# Patient Record
Sex: Female | Born: 1950 | Race: White | Hispanic: No | Smoking: Former smoker
Health system: Southern US, Community
[De-identification: ages and names within clinical notes are randomized; demographics above are authoritative.]

## PROBLEM LIST (undated history)

## (undated) DIAGNOSIS — E079 Disorder of thyroid, unspecified: Secondary | ICD-10-CM

## (undated) DIAGNOSIS — M1712 Unilateral primary osteoarthritis, left knee: Secondary | ICD-10-CM

## (undated) DIAGNOSIS — T8484XA Pain due to internal orthopedic prosthetic devices, implants and grafts, initial encounter: Secondary | ICD-10-CM

## (undated) DIAGNOSIS — Z96652 Presence of left artificial knee joint: Secondary | ICD-10-CM

## (undated) DIAGNOSIS — M1990 Unspecified osteoarthritis, unspecified site: Secondary | ICD-10-CM

## (undated) DIAGNOSIS — R112 Nausea with vomiting, unspecified: Secondary | ICD-10-CM

## (undated) DIAGNOSIS — M1711 Unilateral primary osteoarthritis, right knee: Secondary | ICD-10-CM

## (undated) DIAGNOSIS — M7122 Synovial cyst of popliteal space [Baker], left knee: Secondary | ICD-10-CM

## (undated) DIAGNOSIS — K219 Gastro-esophageal reflux disease without esophagitis: Secondary | ICD-10-CM

## (undated) DIAGNOSIS — I10 Essential (primary) hypertension: Secondary | ICD-10-CM

## (undated) DIAGNOSIS — E039 Hypothyroidism, unspecified: Secondary | ICD-10-CM

## (undated) DIAGNOSIS — J189 Pneumonia, unspecified organism: Secondary | ICD-10-CM

## (undated) DIAGNOSIS — G709 Myoneural disorder, unspecified: Secondary | ICD-10-CM

## (undated) DIAGNOSIS — Z9889 Other specified postprocedural states: Secondary | ICD-10-CM

## (undated) DIAGNOSIS — A048 Other specified bacterial intestinal infections: Secondary | ICD-10-CM

## (undated) DIAGNOSIS — K30 Functional dyspepsia: Secondary | ICD-10-CM

## (undated) HISTORY — PX: OTHER SURGICAL HISTORY: SHX169

## (undated) HISTORY — PX: KNEE SURGERY: SHX244

## (undated) HISTORY — PX: COLONOSCOPY: SHX174

## (undated) HISTORY — DX: Unspecified osteoarthritis, unspecified site: M19.90

## (undated) HISTORY — PX: CHOLECYSTECTOMY: SHX55

## (undated) HISTORY — PX: BLADDER REPAIR: SHX76

## (undated) HISTORY — DX: Other specified bacterial intestinal infections: A04.8

## (undated) HISTORY — PX: ABDOMINAL HYSTERECTOMY: SHX81

## (undated) HISTORY — DX: Functional dyspepsia: K30

## (undated) HISTORY — PX: ESOPHAGOGASTRODUODENOSCOPY: SHX1529

---

## 1986-10-25 HISTORY — PX: TOTAL HIP ARTHROPLASTY: SHX124

## 2005-10-25 HISTORY — PX: TOTAL THYROIDECTOMY: SHX2547

## 2016-10-25 ENCOUNTER — Emergency Department (HOSPITAL_COMMUNITY): Payer: BLUE CROSS/BLUE SHIELD

## 2016-10-25 ENCOUNTER — Encounter (HOSPITAL_COMMUNITY): Payer: Self-pay | Admitting: *Deleted

## 2016-10-25 ENCOUNTER — Emergency Department (HOSPITAL_COMMUNITY)
Admission: EM | Admit: 2016-10-25 | Discharge: 2016-10-25 | Disposition: A | Discharge status: Home / Self Care | Payer: BLUE CROSS/BLUE SHIELD | Attending: Emergency Medicine | Admitting: Emergency Medicine

## 2016-10-25 DIAGNOSIS — Y9389 Activity, other specified: Secondary | ICD-10-CM | POA: Diagnosis not present

## 2016-10-25 DIAGNOSIS — M25511 Pain in right shoulder: Secondary | ICD-10-CM | POA: Insufficient documentation

## 2016-10-25 DIAGNOSIS — Y999 Unspecified external cause status: Secondary | ICD-10-CM | POA: Diagnosis not present

## 2016-10-25 DIAGNOSIS — R0781 Pleurodynia: Secondary | ICD-10-CM | POA: Insufficient documentation

## 2016-10-25 DIAGNOSIS — W19XXXA Unspecified fall, initial encounter: Secondary | ICD-10-CM

## 2016-10-25 DIAGNOSIS — Y92009 Unspecified place in unspecified non-institutional (private) residence as the place of occurrence of the external cause: Secondary | ICD-10-CM | POA: Diagnosis not present

## 2016-10-25 DIAGNOSIS — W0110XA Fall on same level from slipping, tripping and stumbling with subsequent striking against unspecified object, initial encounter: Secondary | ICD-10-CM | POA: Diagnosis not present

## 2016-10-25 HISTORY — DX: Essential (primary) hypertension: I10

## 2016-10-25 HISTORY — DX: Disorder of thyroid, unspecified: E07.9

## 2016-10-25 MED ORDER — TRAMADOL HCL 50 MG PO TABS: 50.0000 mg | ORAL_TABLET | Freq: Four times a day (QID) | ORAL | 0 refills | Status: DC | PRN

## 2016-10-25 NOTE — ED Notes (Signed)
Pt states she was cleaning up from Christmas and tripped over a vacuum cord and table leg hitting her left knee and right shoulder.

## 2016-10-25 NOTE — Discharge Instructions (Signed)
Please read attached information. If you experience any new or worsening signs or symptoms please return to the emergency room for evaluation. Please follow-up with your primary care provider or specialist as discussed. Please use medication prescribed only as directed and discontinue taking if you have any concerning signs or symptoms.   °

## 2016-10-25 NOTE — ED Triage Notes (Signed)
To ED for eval of shoulder/mid-back/scapula pain after falling while taking down decorations. Pt laid ice pack pta to ED without relief. Pt able to move right extremity and no deformity noted. Pulses palpable and strong. No discoloration.

## 2016-10-25 NOTE — ED Provider Notes (Signed)
[facility]-EMERGENCY DEPT Provider Note   CSN: [id number] Arrival date & time: 10/25/16  1811  By signing my name below, I, Clovis Pu, attest that this documentation has been prepared under the direction and in the presence of  [employer], PA-C. Electronically Signed: Clovis Pu, ED Scribe. 10/25/16. 8:24 PM.   History   Chief Complaint Chief Complaint  Patient presents with  . Fall  . Shoulder Pain   The history is provided by the patient. No language interpreter was used.   HPI Comments:  Jackie Cooke is a 66 y.o. female who presents to the Emergency Department complaining of sudden onset right shoulder pain and right ribs pain s/p a fall which occurred today. Her pain is worse when sitting straight and upon palpation. Her pain is better when sitting in a hunched position. Pt states she tripped, landed on her left kneecap and rolled onto her right side. She also notes an abrasion to her left knee but denies any discomfort. No alleviating factors noted. Pt denies numbness, tingling, weakness, SOB, any other associated symptoms and any other modifying factors at this time.    Past Medical History:  Diagnosis Date  . Hypertension   . Thyroid disease     There are no active problems to display for this patient.   Past Surgical History:  Procedure Laterality Date  . ABDOMINAL HYSTERECTOMY    . TOTAL HIP ARTHROPLASTY      OB History    No data available       Home Medications    Prior to Admission medications   Medication Sig Start Date End Date Taking? Authorizing Provider  traMADol (ULTRAM) 50 MG tablet Take 1 tablet (50 mg total) by mouth every 6 (six) hours as needed. 10/25/16   Eyvonne Mechanic, PA-C    Family History No family history on file.  Social History Social History  Substance Use Topics  . Smoking status: Never Smoker  . Smokeless tobacco: Never Used  . Alcohol use Yes     Comment: occasional      Allergies   Celebrex [celecoxib] and Morphine  and related   Review of Systems Review of Systems 10 systems reviewed and all are negative for acute change except as noted in the HPI.  Physical Exam Updated Vital Signs BP 130/71 (BP Location: Left Arm)   Pulse 65   Temp 97.6 F (36.4 C) (Oral)   Resp 16   SpO2 98%   Physical Exam  Constitutional: She is oriented to person, place, and time. She appears well-developed and well-nourished. No distress.  HENT:  Head: Normocephalic and atraumatic.  Eyes: Conjunctivae are normal.  Cardiovascular: Normal rate.   Pulmonary/Chest: Effort normal.  Abdominal: She exhibits no distension.  Musculoskeletal:  Superficial abrasion to left anterior knee. No body tenderness. Significant point tenderness to the posterior ribs inferior to the scapula. Lung sound clear. No crepitus. No signs of trauma.   Neurological: She is alert and oriented to person, place, and time.  Skin: Skin is warm and dry.  Psychiatric: She has a normal mood and affect.  Nursing note and vitals reviewed.  ED Treatments / Results  DIAGNOSTIC STUDIES:  Oxygen Saturation is 98% on RA, normal by my interpretation.    COORDINATION OF CARE:  8:21 PM Discussed treatment plan with pt at bedside and pt agreed to plan.  Labs (all labs ordered are listed, but only abnormal results are displayed) Labs Reviewed - No data to display  EKG  EKG Interpretation  None       Radiology Dg Chest 2 View  Result Date: 10/25/2016 CLINICAL DATA:  Posterior right-sided chest pain and shortness of breath after falling in the home. EXAM: CHEST  2 VIEW COMPARISON:  None. FINDINGS: The heart size and mediastinal contours are within normal limits. Both lungs are clear. The visualized skeletal structures are unremarkable. IMPRESSION: No active cardiopulmonary disease. Electronically Signed   By: Paulina FusiMark  Shogry M.D.   On: 10/25/2016 20:21    Procedures Procedures (including critical care time)  Medications Ordered in ED Medications -  No data to display   Initial Impression / Assessment and Plan / ED Course  I have reviewed the triage vital signs and the nursing notes.  Pertinent labs & imaging results that were available during my care of the patient were reviewed by me and considered in my medical decision making (see chart for details).  Clinical Course     Labs:   Imaging:   Consults:   Therapeutics:   Discharge Meds:  Assessment/Plan: Patient presents status post fall. She has tenderness to the posterior ribs, no signs of acute fracture on exam, clear lung sounds with no respiratory distress. Patient instructed to use Tylenol or ibuprofen, she'll be given a prescription for some stronger pain medication in the event symptoms are not improved with over-the-counter medication. Patient given strict return precautions, she verbalized understanding and agreement to today's plan had no further questions or concerns at time of discharge  Final Clinical Impressions(s) / ED Diagnoses   Final diagnoses:  Fall, initial encounter  Rib pain    New Prescriptions Discharge Medication List as of 10/25/2016  9:16 PM    START taking these medications   Details  traMADol (ULTRAM) 50 MG tablet Take 1 tablet (50 mg total) by mouth every 6 (six) hours as needed., Starting Mon 10/25/2016, Print      I personally performed the services described in this documentation, which was scribed in my presence. The recorded information has been reviewed and is accurate.     Eyvonne MechanicJeffrey Yancarlos Berthold, PA-C 10/26/16 0036    Benjiman CoreNathan Pickering, MD 10/27/16 [phone]

## 2017-01-21 ENCOUNTER — Ambulatory Visit (INDEPENDENT_AMBULATORY_CARE_PROVIDER_SITE_OTHER): Payer: BLUE CROSS/BLUE SHIELD | Admitting: Podiatry

## 2017-01-21 ENCOUNTER — Encounter: Payer: Self-pay | Admitting: Podiatry

## 2017-01-21 DIAGNOSIS — M205X9 Other deformities of toe(s) (acquired), unspecified foot: Secondary | ICD-10-CM

## 2017-01-21 DIAGNOSIS — L84 Corns and callosities: Secondary | ICD-10-CM | POA: Diagnosis not present

## 2017-01-21 NOTE — Progress Notes (Signed)
   Subjective:    Patient ID: Jackie Cooke, female    DOB: 21-Jan-1951, 66 y.o.   MRN: [id number]  HPI  66 year old female presents the office with concerns of a painful corn to the left fourth toe which is been ongoing the last several weeks. She states that when she wears shoes dispensed pressure to the area causing pain. When she wears open tissues this does not cause any problems to the toe.  She was recently on vacation she did cut the bottom of her foot was to get infected she treated this herself and this area resolved. She is no other complaints today.    Review of Systems  Musculoskeletal:       Joint pain  All other systems reviewed and are negative.      Objective:   Physical Exam General: AAO x3, NAD  Dermatological: Hyperkeratotic lesion along the lateral aspect of the left fourth toe on the PIPJ from with the fourth and fifth toes are abutting. Upon debridement there is no underlying ulceration, drainage or any signs of infection. Small scars present the sulcus of the second toe plantarly on the left foot from where she cut herself previously with a serious heel there is no local signs of infection. Is no edema, erythema, increased warmth. There is no open lesions or pre-ulcerative lesions at this time.  Vascular: Dorsalis Pedis artery and Posterior Tibial artery pedal pulses are 2/4 bilateral with immedate capillary fill time. Pedal hair growth present. There is no pain with calf compression, swelling, warmth, erythema.   Neruologic: Grossly intact via light touch bilateral. Vibratory intact via tuning fork bilateral. Protective threshold with Semmes Wienstein monofilament intact to all pedal sites bilateral.   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 Muscular strength 5/5 in all groups tested bilateral.  Gait: Unassisted, Nonantalgic.     Assessment & Plan:  66 year old female with hyperkeratotic lesion left fourth toe due to  adductovarus/digital deformity -Treatment options discussed including all alternatives, risks, and complications -Etiology of symptoms were discussed -Hyperkeratotic lesion sharply debrided without complications or bleeding. Area was cleaned and a pad was placed bilateral salicylic acid and a bandage. Post procedure injections were discussed. Monitor for infection. Offloading pads dispensed. -Follow up with symptoms not resolve the next couple weeks or sooner if any issues are to arise. Encouraged to call any questions or concerns or any change in symptoms.  Ovid Curd, DPM

## 2017-05-17 ENCOUNTER — Other Ambulatory Visit (HOSPITAL_COMMUNITY): Payer: Self-pay | Admitting: Orthopedic Surgery

## 2017-05-17 ENCOUNTER — Ambulatory Visit (HOSPITAL_COMMUNITY)
Admission: RE | Admit: 2017-05-17 | Discharge: 2017-05-17 | Disposition: A | Discharge status: Home / Self Care | Payer: BLUE CROSS/BLUE SHIELD | Source: Ambulatory Visit | Attending: Internal Medicine | Admitting: Internal Medicine

## 2017-05-17 DIAGNOSIS — M7989 Other specified soft tissue disorders: Secondary | ICD-10-CM | POA: Insufficient documentation

## 2017-05-17 DIAGNOSIS — M79605 Pain in left leg: Secondary | ICD-10-CM

## 2017-06-30 ENCOUNTER — Other Ambulatory Visit: Payer: Self-pay | Admitting: Orthopedic Surgery

## 2017-06-30 DIAGNOSIS — M7122 Synovial cyst of popliteal space [Baker], left knee: Secondary | ICD-10-CM

## 2017-07-05 ENCOUNTER — Other Ambulatory Visit: Payer: BLUE CROSS/BLUE SHIELD

## 2017-07-05 ENCOUNTER — Ambulatory Visit
Admission: RE | Admit: 2017-07-05 | Discharge: 2017-07-05 | Disposition: A | Discharge status: Home / Self Care | Payer: BLUE CROSS/BLUE SHIELD | Source: Ambulatory Visit | Attending: Orthopedic Surgery | Admitting: Orthopedic Surgery

## 2017-07-05 DIAGNOSIS — M7122 Synovial cyst of popliteal space [Baker], left knee: Secondary | ICD-10-CM

## 2017-07-06 ENCOUNTER — Other Ambulatory Visit: Payer: BLUE CROSS/BLUE SHIELD

## 2017-11-30 ENCOUNTER — Other Ambulatory Visit (HOSPITAL_COMMUNITY): Payer: BLUE CROSS/BLUE SHIELD

## 2017-12-01 ENCOUNTER — Other Ambulatory Visit (HOSPITAL_COMMUNITY): Payer: BLUE CROSS/BLUE SHIELD

## 2017-12-12 ENCOUNTER — Inpatient Hospital Stay: Admit: 2017-12-12 | Payer: BLUE CROSS/BLUE SHIELD | Admitting: Orthopedic Surgery

## 2017-12-12 SURGERY — ARTHROPLASTY, KNEE, TOTAL
Anesthesia: Spinal | Laterality: Right

## 2017-12-23 ENCOUNTER — Other Ambulatory Visit (HOSPITAL_COMMUNITY): Payer: BLUE CROSS/BLUE SHIELD

## 2017-12-30 ENCOUNTER — Inpatient Hospital Stay (HOSPITAL_COMMUNITY): Admission: RE | Admit: 2017-12-30 | Payer: BLUE CROSS/BLUE SHIELD | Source: Ambulatory Visit

## 2018-01-09 ENCOUNTER — Inpatient Hospital Stay: Admit: 2018-01-09 | Payer: BLUE CROSS/BLUE SHIELD | Admitting: Orthopedic Surgery

## 2018-01-09 SURGERY — ARTHROPLASTY, KNEE, TOTAL
Anesthesia: Spinal | Site: Knee | Laterality: Right

## 2018-02-20 ENCOUNTER — Other Ambulatory Visit: Payer: Self-pay | Admitting: Physician Assistant

## 2018-02-20 ENCOUNTER — Encounter: Payer: Self-pay | Admitting: Physician Assistant

## 2018-02-20 DIAGNOSIS — I10 Essential (primary) hypertension: Secondary | ICD-10-CM

## 2018-02-20 DIAGNOSIS — E039 Hypothyroidism, unspecified: Secondary | ICD-10-CM

## 2018-02-20 DIAGNOSIS — M1712 Unilateral primary osteoarthritis, left knee: Secondary | ICD-10-CM

## 2018-02-20 HISTORY — DX: Hypothyroidism, unspecified: E03.9

## 2018-02-20 HISTORY — DX: Essential (primary) hypertension: I10

## 2018-02-20 HISTORY — DX: Unilateral primary osteoarthritis, left knee: M17.12

## 2018-02-20 NOTE — H&P (Signed)
TOTAL KNEE ADMISSION H&P  Patient is being admitted for left total knee arthroplasty.  Subjective:  Chief Complaint:left knee pain.  HPI: Jackie Cooke, 67 y.o. female, has a history of pain and functional disability in the left knee due to arthritis and has failed non-surgical conservative treatments for greater than 12 weeks to includeNSAID's and/or analgesics, corticosteriod injections, viscosupplementation injections, flexibility and strengthening excercises, supervised PT with diminished ADL's post treatment, use of assistive devices and activity modification.  Onset of symptoms was gradual, starting 10 years ago with gradually worsening course since that time. The patient noted prior procedures on the knee to include  arthroscopy, menisectomy and baker's cyst removal on the left knee(s).  Patient currently rates pain in the left knee(s) at 10 out of 10 with activity. Patient has night pain, worsening of pain with activity and weight bearing, pain that interferes with activities of daily living, crepitus and joint swelling.  Patient has evidence of subchondral sclerosis, periarticular osteophytes and joint space narrowing by imaging studies.There is no active infection.  Patient Active Problem List   Diagnosis Date Noted  . Primary localized osteoarthritis of left knee 02/20/2018  . Essential hypertension, benign 02/20/2018  . Hypothyroidism 02/20/2018   Past Medical History:  Diagnosis Date  . Essential hypertension, benign 02/20/2018  . Hypertension   . Hypothyroidism 02/20/2018  . PONV (postoperative nausea and vomiting)   . Primary localized osteoarthritis of left knee 02/20/2018  . Thyroid disease     Past Surgical History:  Procedure Laterality Date  . ABDOMINAL HYSTERECTOMY    . TOTAL HIP ARTHROPLASTY Right 1988  . TOTAL THYROIDECTOMY  2007    No current facility-administered medications for this encounter.    Current Outpatient Medications  Medication Sig Dispense Refill  Last Dose  . escitalopram (LEXAPRO) 10 MG tablet Take 10 mg by mouth.   02/20/2018 at Unknown time  . estradiol (ESTRACE) 1 MG tablet Take 1 mg by mouth.   02/20/2018 at Unknown time  . levothyroxine (SYNTHROID, LEVOTHROID) 137 MCG tablet Take 137 mcg by mouth daily before breakfast.    02/20/2018 at Unknown time  . losartan-hydrochlorothiazide (HYZAAR) 50-12.5 MG tablet Take 1 tablet by mouth daily.    02/20/2018 at Unknown time  . NIFEdipine (PROCARDIA-XL/ADALAT-CC/NIFEDICAL-XL) 30 MG 24 hr tablet Take 30 mg by mouth daily.    02/20/2018 at Unknown time  . oxybutynin (DITROPAN-XL) 5 MG 24 hr tablet Take 5 mg by mouth daily.  0 02/20/2018 at Unknown time  . predniSONE (DELTASONE) 5 MG tablet Take 5 mg by mouth daily with breakfast.      Allergies  Allergen Reactions  . Morphine And Related Anaphylaxis  . Celebrex [Celecoxib]     Hives    Social History   Tobacco Use  . Smoking status: Never Smoker  . Smokeless tobacco: Never Used  Substance Use Topics  . Alcohol use: Yes    Comment: occasional     Family History  Problem Relation Age of Onset  . Hypertension Mother   . Pulmonary fibrosis Father   . Thyroid cancer Sister   . Diabetes Mellitus II Brother      Review of Systems  Constitutional: Negative.   HENT: Negative.   Eyes: Negative.   Respiratory: Negative.   Cardiovascular: Negative.   Gastrointestinal: Negative.   Genitourinary: Negative.   Musculoskeletal: Positive for back pain and joint pain.  Skin: Negative.   Neurological: Negative.   Endo/Heme/Allergies: Negative.   Psychiatric/Behavioral: Negative.     Objective:  Physical Exam  Constitutional: She is oriented to person, place, and time. She appears well-developed and well-nourished.  HENT:  Head: Normocephalic and atraumatic.  Mouth/Throat: Oropharynx is clear and moist.  Eyes: Pupils are equal, round, and reactive to light. Conjunctivae are normal.  Neck: Neck supple.  Cardiovascular: Normal rate and  regular rhythm.  Respiratory: Effort normal and breath sounds normal.  GI: Soft. Bowel sounds are normal.  Genitourinary:  Genitourinary Comments: Not pertinent to current symptomatology therefore not examined.  Musculoskeletal:  Left knee pain all the time.  Recurrent swelling posteriorly.  2+ radial pulse.  3+ crep. 2+ synovitis, 3+effusion.   0-90 degrees.  Right knee 0-120 2+ crep 2+ synovitis DNVI  Neurological: She is alert and oriented to person, place, and time.  Skin: Skin is warm and dry.  Psychiatric: She has a normal mood and affect. Her behavior is normal.    Vital signs in last 24 hours: Weight:  [79.4 kg (175 lb)] 79.4 kg (175 lb) (04/29 1700)  Labs:   Estimated body mass index is 26.61 kg/m as calculated from the following:   Height as of this encounter:  (1.727 m).   Weight as of this encounter: 79.4 kg (175 lb).   Imaging Review Plain radiographs demonstrate severe degenerative joint disease of the left knee(s). The overall alignment issignificant varus. The bone quality appears to be good for age and reported activity level.   Preoperative templating of the joint replacement has been completed, documented, and submitted to the Operating Room personnel in order to optimize intra-operative equipment management.    Patient's anticipated LOS is less than 2 midnights, meeting these requirements: - Younger than 46 - Lives within 1 hour of care - Has a competent adult at home to recover with post-op recover - NO history of  - Chronic pain requiring opiods  - Diabetes  - Coronary Artery Disease  - Heart failure  - Heart attack  - Stroke  - DVT/VTE  - Cardiac arrhythmia  - Respiratory Failure/COPD  - Renal failure  - Anemia  - Advanced Liver disease        Assessment/Plan:  End stage arthritis, left knee  Principal Problem:   Primary localized osteoarthritis of left knee Active Problems:   Essential hypertension, benign    Hypothyroidism   The patient history, physical examination, clinical judgment of the provider and imaging studies are consistent with end stage degenerative joint disease of the left knee(s) and total knee arthroplasty is deemed medically necessary. The treatment options including medical management, injection therapy arthroscopy and arthroplasty were discussed at length. The risks and benefits of total knee arthroplasty were presented and reviewed. The risks due to aseptic loosening, infection, stiffness, patella tracking problems, thromboembolic complications and other imponderables were discussed. The patient acknowledged the explanation, agreed to proceed with the plan and consent was signed. Patient is being admitted for inpatient treatment for surgery, pain control, PT, OT, prophylactic antibiotics, VTE prophylaxis, progressive ambulation and ADL's and discharge planning. The patient is planning to be discharged home with home health services

## 2018-02-23 ENCOUNTER — Encounter (HOSPITAL_COMMUNITY): Payer: Self-pay

## 2018-02-23 ENCOUNTER — Encounter (HOSPITAL_COMMUNITY)
Admission: RE | Admit: 2018-02-23 | Discharge: 2018-02-23 | Disposition: A | Discharge status: Home / Self Care | Payer: Medicare Other | Source: Ambulatory Visit | Attending: Orthopedic Surgery | Admitting: Orthopedic Surgery

## 2018-02-23 ENCOUNTER — Other Ambulatory Visit: Payer: Self-pay

## 2018-02-23 DIAGNOSIS — Z01812 Encounter for preprocedural laboratory examination: Secondary | ICD-10-CM | POA: Diagnosis not present

## 2018-02-23 HISTORY — DX: Gastro-esophageal reflux disease without esophagitis: K21.9

## 2018-02-23 LAB — CBC WITH DIFFERENTIAL/PLATELET
BASOS ABS: 0 10*3/uL (ref 0.0–0.1)
Basophils Relative: 1 %
Eosinophils Absolute: 0 10*3/uL (ref 0.0–0.7)
Eosinophils Relative: 0 %
HEMATOCRIT: 39.6 % (ref 36.0–46.0)
HEMOGLOBIN: 13 g/dL (ref 12.0–15.0)
LYMPHS PCT: 24 %
Lymphs Abs: 1.4 10*3/uL (ref 0.7–4.0)
MCH: 31.1 pg (ref 26.0–34.0)
MCHC: 32.8 g/dL (ref 30.0–36.0)
MCV: 94.7 fL (ref 78.0–100.0)
MONO ABS: 0.5 10*3/uL (ref 0.1–1.0)
MONOS PCT: 8 %
NEUTROS ABS: 4.1 10*3/uL (ref 1.7–7.7)
Neutrophils Relative %: 67 %
Platelets: 351 10*3/uL (ref 150–400)
RBC: 4.18 MIL/uL (ref 3.87–5.11)
RDW: 14.5 % (ref 11.5–15.5)
WBC: 6 10*3/uL (ref 4.0–10.5)

## 2018-02-23 LAB — SURGICAL PCR SCREEN
MRSA, PCR: NEGATIVE
Staphylococcus aureus: NEGATIVE

## 2018-02-23 LAB — COMPREHENSIVE METABOLIC PANEL
ALK PHOS: 45 U/L (ref 38–126)
ALT: 18 U/L (ref 14–54)
AST: 19 U/L (ref 15–41)
Albumin: 3.7 g/dL (ref 3.5–5.0)
Anion gap: 10 (ref 5–15)
BILIRUBIN TOTAL: 0.5 mg/dL (ref 0.3–1.2)
BUN: 18 mg/dL (ref 6–20)
CALCIUM: 9.1 mg/dL (ref 8.9–10.3)
CO2: 27 mmol/L (ref 22–32)
Chloride: 103 mmol/L (ref 101–111)
Creatinine, Ser: 1.08 mg/dL — ABNORMAL HIGH (ref 0.44–1.00)
GFR calc Af Amer: >60 mL/min (ref >60)
GFR calc non Af Amer: 53 mL/min — ABNORMAL LOW (ref >60)
Glucose, Bld: 120 mg/dL — ABNORMAL HIGH (ref 65–99)
POTASSIUM: 3.8 mmol/L (ref 3.5–5.1)
Sodium: 140 mmol/L (ref 135–145)
TOTAL PROTEIN: 6.4 g/dL — AB (ref 6.5–8.1)

## 2018-02-23 LAB — PROTIME-INR
INR: 0.99
PROTHROMBIN TIME: 13 s (ref 11.4–15.2)

## 2018-02-23 LAB — ABO/RH: ABO/RH(D): A POS

## 2018-02-23 LAB — APTT: aPTT: 26 seconds (ref 24–36)

## 2018-02-23 NOTE — Pre-Procedure Instructions (Signed)
Jackie Cooke  02/23/2018      [id number] - [place], [place] - [address] AT [address] OF [facility] [address] [place] [address] Phone: [phone] Fax: [phone]    Your procedure is scheduled on May 13  Report to [facility] Admitting at 800A.M.  Call this number if you have problems the morning of surgery:  [phone]   Remember:  Do not eat food or drink liquids after midnight.  Take these medicines the morning of surgery with A SIP OF WATER Escitalopram (Lexpro), levothyroxine (synthroid), Nifedipine (Procardia-XL), oxybutynin (Ditropan-XL), prednisone (Deltasone)   Stop taking aspirin, BC's, Goody's, Herbal medications, Fish Oil, Ibuprofen, Advil, Motrin, Aleve,vitamins   Do not wear jewelry, make-up or nail polish.  Do not wear lotions, powders, or perfumes, or deodorant.  Do not shave 48 hours prior to surgery.  Men may shave face and neck.  Do not bring valuables to the hospital.  [facility] is not responsible for any belongings or valuables.  Contacts, dentures or bridgework may not be worn into surgery.  Leave your suitcase in the car.  After surgery it may be brought to your room.  For patients admitted to the hospital, discharge time will be determined by your treatment team.  Patients discharged the day of surgery will not be allowed to drive home.    Special instructions: [facility] - Preparing for Surgery  Before surgery, you can play an important role.  Because skin is not sterile, your skin needs to be as free of germs as possible.  You can reduce the number of germs on you skin by washing with CHG (chlorahexidine gluconate) soap before surgery.  CHG is an antiseptic cleaner which kills germs and bonds with the skin to continue killing germs even after washing.  Please DO NOT use if you have an allergy to CHG or antibacterial soaps.  If your skin becomes reddened/irritated stop using the CHG and  inform your nurse when you arrive at Short Stay.  Do not shave (including legs and underarms) for at least 48 hours prior to the first CHG shower.  You may shave your face.  Please follow these instructions carefully:   1.  Shower with CHG Soap the night before surgery and the  morning of Surgery.  2.  If you choose to wash your hair, wash your hair first as usual with your normal shampoo.  3.  After you shampoo, rinse your hair and body thoroughly to remove the   Shampoo.  4.  Use CHG as you would any other liquid soap.  You can apply chg directly  to the skin and wash gently with scrungie or a clean washcloth.  5.  Apply the CHG Soap to your body ONLY FROM THE NECK DOWN.   Do not use on open wounds or open sores.  Avoid contact with your eyes,  ears, mouth and genitals (private parts).  Wash genitals (private parts)  with your normal soap.  6.  Wash thoroughly, paying special attention to the area where your surgery will be performed.  7.  Thoroughly rinse your body with warm water from the neck down.  8.  DO NOT shower/wash with your normal soap after using and rinsing off the CHG Soap.  9.  Pat yourself dry with a clean towel.            10.  Wear clean pajamas.  11.  Place clean sheets on your bed the night of your first shower and do not sleep with pets.  Day of Surgery  Do not apply any lotions/deoderants the morning of surgery.  Please wear clean clothes to the hospital/surgery center.     Please read over the following fact sheets that you were given. Pain Booklet, Coughing and Deep Breathing, MRSA Information and Surgical Site Infection Prevention, Incentive Spiromerty

## 2018-02-23 NOTE — Progress Notes (Signed)
PCP is [name], FNP at [facility] medicine [place] Denies ever seeing a cardiologist. Denies ever having a stress test, card cath, or echo Denies chest pain, cough, or fever.

## 2018-02-24 LAB — URINE CULTURE: CULTURE: NO GROWTH

## 2018-02-24 LAB — TYPE AND SCREEN
ABO/RH(D): A POS
ANTIBODY SCREEN: NEGATIVE

## 2018-03-03 ENCOUNTER — Other Ambulatory Visit: Payer: Self-pay | Admitting: Orthopedic Surgery

## 2018-03-03 MED ORDER — BUPIVACAINE LIPOSOME 1.3 % IJ SUSP
20.0000 mL | INTRAMUSCULAR | Status: DC
  Filled 2018-03-03: qty 20

## 2018-03-03 MED ORDER — TRANEXAMIC ACID 1000 MG/10ML IV SOLN
1000.0000 mg | INTRAVENOUS | Status: AC
  Administered 2018-03-06: 1000 mg via INTRAVENOUS
  Filled 2018-03-03: qty 1100

## 2018-03-03 MED ORDER — LACTATED RINGERS IV SOLN
INTRAVENOUS | Status: DC
  Administered 2018-03-06 (×2): via INTRAVENOUS

## 2018-03-03 MED ORDER — CEFAZOLIN SODIUM-DEXTROSE 2-4 GM/100ML-% IV SOLN
2.0000 g | INTRAVENOUS | Status: AC
  Administered 2018-03-06: 2 g via INTRAVENOUS
  Filled 2018-03-03: qty 100

## 2018-03-03 NOTE — Care Plan (Signed)
Spoke with patient prior to surgery. She is planning to discharge to home with family and HHPT provided by [facility] at home. She has all needed equipment, including CPM that has already been delivered. Her follow up appointment with Dr Thurston Hole will be 03/21/18 @ 1000 She will transition to OPPT following that appointment at [facility].  She is in agreement with this plan.   Please contact Renee Angiulli, RNCM with questions or if the above plan needs to change. [phone]    Thanks

## 2018-03-06 ENCOUNTER — Inpatient Hospital Stay (HOSPITAL_COMMUNITY): Payer: Medicare Other | Admitting: Anesthesiology

## 2018-03-06 ENCOUNTER — Encounter (HOSPITAL_COMMUNITY): Payer: Self-pay

## 2018-03-06 ENCOUNTER — Other Ambulatory Visit: Payer: Self-pay

## 2018-03-06 ENCOUNTER — Inpatient Hospital Stay (HOSPITAL_COMMUNITY)
Admission: RE | Admit: 2018-03-06 | Discharge: 2018-03-08 | DRG: 470 | Disposition: A | Discharge status: Home Health | Payer: Medicare Other | Attending: Orthopedic Surgery | Admitting: Orthopedic Surgery

## 2018-03-06 ENCOUNTER — Encounter (HOSPITAL_COMMUNITY)
Admission: RE | Disposition: A | Discharge status: Home Health | Payer: Self-pay | Source: Home / Self Care | Attending: Orthopedic Surgery

## 2018-03-06 DIAGNOSIS — M7122 Synovial cyst of popliteal space [Baker], left knee: Secondary | ICD-10-CM | POA: Diagnosis present

## 2018-03-06 DIAGNOSIS — Z79890 Hormone replacement therapy (postmenopausal): Secondary | ICD-10-CM | POA: Diagnosis not present

## 2018-03-06 DIAGNOSIS — Z90710 Acquired absence of both cervix and uterus: Secondary | ICD-10-CM | POA: Diagnosis not present

## 2018-03-06 DIAGNOSIS — M1712 Unilateral primary osteoarthritis, left knee: Secondary | ICD-10-CM | POA: Diagnosis present

## 2018-03-06 DIAGNOSIS — I10 Essential (primary) hypertension: Secondary | ICD-10-CM | POA: Diagnosis present

## 2018-03-06 DIAGNOSIS — M25762 Osteophyte, left knee: Secondary | ICD-10-CM | POA: Diagnosis present

## 2018-03-06 DIAGNOSIS — Z96641 Presence of right artificial hip joint: Secondary | ICD-10-CM | POA: Diagnosis present

## 2018-03-06 DIAGNOSIS — D72829 Elevated white blood cell count, unspecified: Secondary | ICD-10-CM | POA: Diagnosis present

## 2018-03-06 DIAGNOSIS — E890 Postprocedural hypothyroidism: Secondary | ICD-10-CM | POA: Diagnosis present

## 2018-03-06 DIAGNOSIS — K219 Gastro-esophageal reflux disease without esophagitis: Secondary | ICD-10-CM | POA: Diagnosis present

## 2018-03-06 DIAGNOSIS — Z8249 Family history of ischemic heart disease and other diseases of the circulatory system: Secondary | ICD-10-CM

## 2018-03-06 DIAGNOSIS — Z888 Allergy status to other drugs, medicaments and biological substances status: Secondary | ICD-10-CM

## 2018-03-06 DIAGNOSIS — Z79899 Other long term (current) drug therapy: Secondary | ICD-10-CM | POA: Diagnosis not present

## 2018-03-06 DIAGNOSIS — M7120 Synovial cyst of popliteal space [Baker], unspecified knee: Secondary | ICD-10-CM | POA: Diagnosis present

## 2018-03-06 DIAGNOSIS — M21062 Valgus deformity, not elsewhere classified, left knee: Secondary | ICD-10-CM | POA: Diagnosis present

## 2018-03-06 DIAGNOSIS — M25562 Pain in left knee: Secondary | ICD-10-CM | POA: Diagnosis present

## 2018-03-06 DIAGNOSIS — Z7952 Long term (current) use of systemic steroids: Secondary | ICD-10-CM

## 2018-03-06 DIAGNOSIS — Z885 Allergy status to narcotic agent status: Secondary | ICD-10-CM

## 2018-03-06 DIAGNOSIS — E039 Hypothyroidism, unspecified: Secondary | ICD-10-CM | POA: Diagnosis present

## 2018-03-06 HISTORY — DX: Essential (primary) hypertension: I10

## 2018-03-06 HISTORY — DX: Nausea with vomiting, unspecified: R11.2

## 2018-03-06 HISTORY — DX: Synovial cyst of popliteal space (Baker), left knee: M71.22

## 2018-03-06 HISTORY — PX: TOTAL KNEE ARTHROPLASTY: SHX125

## 2018-03-06 HISTORY — DX: Other specified postprocedural states: Z98.890

## 2018-03-06 HISTORY — DX: Hypothyroidism, unspecified: E03.9

## 2018-03-06 HISTORY — DX: Unilateral primary osteoarthritis, left knee: M17.12

## 2018-03-06 SURGERY — ARTHROPLASTY, KNEE, TOTAL
Anesthesia: Monitor Anesthesia Care | Laterality: Left

## 2018-03-06 MED ORDER — PROPOFOL 10 MG/ML IV BOLUS
INTRAVENOUS | Status: DC | PRN
  Administered 2018-03-06: 20 mg via INTRAVENOUS

## 2018-03-06 MED ORDER — CHLORHEXIDINE GLUCONATE 4 % EX LIQD: 60.0000 mL | Freq: Once | CUTANEOUS | Status: DC

## 2018-03-06 MED ORDER — DIPHENHYDRAMINE HCL 12.5 MG/5ML PO ELIX
12.5000 mg | ORAL_SOLUTION | ORAL | Status: DC | PRN
Start: 2018-03-06 — End: 2018-03-08
  Administered 2018-03-07: 25 mg via ORAL
  Filled 2018-03-06: qty 10

## 2018-03-06 MED ORDER — DEXAMETHASONE SODIUM PHOSPHATE 10 MG/ML IJ SOLN
INTRAMUSCULAR | Status: DC | PRN
  Administered 2018-03-06: 10 mg via INTRAVENOUS

## 2018-03-06 MED ORDER — POLYETHYLENE GLYCOL 3350 17 G PO PACK
17.0000 g | PACK | Freq: Two times a day (BID) | ORAL | Status: DC
  Administered 2018-03-06 – 2018-03-08 (×4): 17 g via ORAL
  Filled 2018-03-06 (×4): qty 1

## 2018-03-06 MED ORDER — METOCLOPRAMIDE HCL 5 MG PO TABS: 5.0000 mg | ORAL_TABLET | Freq: Three times a day (TID) | ORAL | Status: DC | PRN

## 2018-03-06 MED ORDER — FENTANYL CITRATE (PF) 100 MCG/2ML IJ SOLN
100.0000 ug | Freq: Once | INTRAMUSCULAR | Status: AC
  Administered 2018-03-06: 100 ug via INTRAVENOUS

## 2018-03-06 MED ORDER — MIDAZOLAM HCL 5 MG/5ML IJ SOLN
INTRAMUSCULAR | Status: DC | PRN
  Administered 2018-03-06 (×2): 1 mg via INTRAVENOUS

## 2018-03-06 MED ORDER — FENTANYL CITRATE (PF) 100 MCG/2ML IJ SOLN
INTRAMUSCULAR | Status: AC
  Filled 2018-03-06: qty 2

## 2018-03-06 MED ORDER — ESCITALOPRAM OXALATE 10 MG PO TABS
10.0000 mg | ORAL_TABLET | Freq: Every day | ORAL | Status: DC
  Administered 2018-03-07 – 2018-03-08 (×2): 10 mg via ORAL
  Filled 2018-03-06 (×2): qty 1

## 2018-03-06 MED ORDER — BUPIVACAINE LIPOSOME 1.3 % IJ SUSP
INTRAMUSCULAR | Status: DC | PRN
  Administered 2018-03-06: 20 mL

## 2018-03-06 MED ORDER — SODIUM CHLORIDE 0.9 % IJ SOLN
INTRAMUSCULAR | Status: DC | PRN
  Administered 2018-03-06: 50 mL

## 2018-03-06 MED ORDER — BUPIVACAINE IN DEXTROSE 0.75-8.25 % IT SOLN
INTRATHECAL | Status: DC | PRN
  Administered 2018-03-06: 15 mg via INTRATHECAL

## 2018-03-06 MED ORDER — ROPIVACAINE HCL 5 MG/ML IJ SOLN
INTRAMUSCULAR | Status: DC | PRN
  Administered 2018-03-06: 150 mg via PERINEURAL

## 2018-03-06 MED ORDER — CEFAZOLIN SODIUM-DEXTROSE 2-4 GM/100ML-% IV SOLN
2.0000 g | Freq: Four times a day (QID) | INTRAVENOUS | Status: AC
  Administered 2018-03-06 (×2): 2 g via INTRAVENOUS
  Filled 2018-03-06 (×2): qty 100

## 2018-03-06 MED ORDER — HYDROMORPHONE HCL 2 MG/ML IJ SOLN
0.5000 mg | INTRAMUSCULAR | Status: DC | PRN
  Administered 2018-03-07: 1 mg via INTRAVENOUS
  Administered 2018-03-07: 0.5 mg via INTRAVENOUS
  Filled 2018-03-06 (×2): qty 1

## 2018-03-06 MED ORDER — DEXAMETHASONE SODIUM PHOSPHATE 10 MG/ML IJ SOLN
INTRAMUSCULAR | Status: AC
  Filled 2018-03-06: qty 2

## 2018-03-06 MED ORDER — PROMETHAZINE HCL 25 MG/ML IJ SOLN: 6.2500 mg | INTRAMUSCULAR | Status: DC | PRN

## 2018-03-06 MED ORDER — ONDANSETRON HCL 4 MG PO TABS: 4.0000 mg | ORAL_TABLET | Freq: Four times a day (QID) | ORAL | Status: DC | PRN

## 2018-03-06 MED ORDER — MIDAZOLAM HCL 2 MG/2ML IJ SOLN
INTRAMUSCULAR | Status: AC
  Administered 2018-03-06: 2 mg via INTRAVENOUS
  Filled 2018-03-06: qty 2

## 2018-03-06 MED ORDER — SODIUM CHLORIDE 0.9 % IJ SOLN
INTRAMUSCULAR | Status: AC
  Filled 2018-03-06: qty 10

## 2018-03-06 MED ORDER — SUCCINYLCHOLINE CHLORIDE 200 MG/10ML IV SOSY
PREFILLED_SYRINGE | INTRAVENOUS | Status: AC
  Filled 2018-03-06: qty 10

## 2018-03-06 MED ORDER — OXYCODONE HCL 5 MG PO TABS
5.0000 mg | ORAL_TABLET | ORAL | Status: DC | PRN
  Administered 2018-03-06 – 2018-03-08 (×8): 10 mg via ORAL
  Administered 2018-03-08: 5 mg via ORAL
  Administered 2018-03-08: 10 mg via ORAL
  Filled 2018-03-06 (×5): qty 2
  Filled 2018-03-06: qty 1
  Filled 2018-03-06 (×4): qty 2

## 2018-03-06 MED ORDER — OXYBUTYNIN CHLORIDE ER 5 MG PO TB24
5.0000 mg | ORAL_TABLET | Freq: Every day | ORAL | Status: DC
  Administered 2018-03-07 – 2018-03-08 (×2): 5 mg via ORAL
  Filled 2018-03-06 (×2): qty 1

## 2018-03-06 MED ORDER — ALUM & MAG HYDROXIDE-SIMETH 200-200-20 MG/5ML PO SUSP: 30.0000 mL | ORAL | Status: DC | PRN

## 2018-03-06 MED ORDER — BUPIVACAINE-EPINEPHRINE 0.5% -1:200000 IJ SOLN
INTRAMUSCULAR | Status: DC | PRN
  Administered 2018-03-06: 50 mL

## 2018-03-06 MED ORDER — ONDANSETRON HCL 4 MG/2ML IJ SOLN: 4.0000 mg | Freq: Four times a day (QID) | INTRAMUSCULAR | Status: DC | PRN

## 2018-03-06 MED ORDER — FENTANYL CITRATE (PF) 100 MCG/2ML IJ SOLN
INTRAMUSCULAR | Status: AC
  Administered 2018-03-06: 100 ug via INTRAVENOUS
  Filled 2018-03-06: qty 2

## 2018-03-06 MED ORDER — MIDAZOLAM HCL 2 MG/2ML IJ SOLN
INTRAMUSCULAR | Status: AC
  Filled 2018-03-06: qty 2

## 2018-03-06 MED ORDER — CEFUROXIME SODIUM 750 MG IJ SOLR
INTRAMUSCULAR | Status: AC
  Filled 2018-03-06: qty 1500

## 2018-03-06 MED ORDER — PHENYLEPHRINE 40 MCG/ML (10ML) SYRINGE FOR IV PUSH (FOR BLOOD PRESSURE SUPPORT)
PREFILLED_SYRINGE | INTRAVENOUS | Status: AC
  Filled 2018-03-06: qty 10

## 2018-03-06 MED ORDER — POTASSIUM CHLORIDE IN NACL 20-0.9 MEQ/L-% IV SOLN
INTRAVENOUS | Status: DC
  Administered 2018-03-06 – 2018-03-07 (×3): via INTRAVENOUS
  Filled 2018-03-06 (×3): qty 1000

## 2018-03-06 MED ORDER — PREDNISONE 5 MG PO TABS
5.0000 mg | ORAL_TABLET | Freq: Every day | ORAL | Status: DC
  Administered 2018-03-08: 5 mg via ORAL
  Filled 2018-03-06 (×2): qty 1

## 2018-03-06 MED ORDER — PHENOL 1.4 % MT LIQD: 1.0000 | OROMUCOSAL | Status: DC | PRN

## 2018-03-06 MED ORDER — MIDAZOLAM HCL 2 MG/2ML IJ SOLN
2.0000 mg | Freq: Once | INTRAMUSCULAR | Status: AC
Start: 2018-03-06 — End: 2018-03-06
  Administered 2018-03-06: 2 mg via INTRAVENOUS

## 2018-03-06 MED ORDER — ONDANSETRON HCL 4 MG/2ML IJ SOLN
INTRAMUSCULAR | Status: AC
  Filled 2018-03-06: qty 4

## 2018-03-06 MED ORDER — BUPIVACAINE-EPINEPHRINE (PF) 0.5% -1:200000 IJ SOLN
INTRAMUSCULAR | Status: AC
  Filled 2018-03-06: qty 60

## 2018-03-06 MED ORDER — PROPOFOL 500 MG/50ML IV EMUL
INTRAVENOUS | Status: DC | PRN
  Administered 2018-03-06: 75 ug/kg/min via INTRAVENOUS

## 2018-03-06 MED ORDER — NIFEDIPINE ER OSMOTIC RELEASE 30 MG PO TB24
30.0000 mg | ORAL_TABLET | Freq: Every day | ORAL | Status: DC
  Administered 2018-03-07 – 2018-03-08 (×2): 30 mg via ORAL
  Filled 2018-03-06 (×2): qty 1

## 2018-03-06 MED ORDER — VITAMIN D 1000 UNITS PO TABS
1000.0000 [IU] | ORAL_TABLET | Freq: Every day | ORAL | Status: DC
  Administered 2018-03-07 – 2018-03-08 (×2): 1000 [IU] via ORAL
  Filled 2018-03-06 (×2): qty 1

## 2018-03-06 MED ORDER — ASPIRIN EC 325 MG PO TBEC
325.0000 mg | DELAYED_RELEASE_TABLET | Freq: Every day | ORAL | Status: DC
  Administered 2018-03-07 – 2018-03-08 (×2): 325 mg via ORAL
  Filled 2018-03-06 (×2): qty 1

## 2018-03-06 MED ORDER — FENTANYL CITRATE (PF) 100 MCG/2ML IJ SOLN
25.0000 ug | INTRAMUSCULAR | Status: DC | PRN
  Administered 2018-03-06 (×2): 50 ug via INTRAVENOUS

## 2018-03-06 MED ORDER — DEXAMETHASONE SODIUM PHOSPHATE 10 MG/ML IJ SOLN
10.0000 mg | Freq: Three times a day (TID) | INTRAMUSCULAR | Status: AC
  Administered 2018-03-06 – 2018-03-07 (×4): 10 mg via INTRAVENOUS
  Filled 2018-03-06 (×4): qty 1

## 2018-03-06 MED ORDER — LIDOCAINE 2% (20 MG/ML) 5 ML SYRINGE
INTRAMUSCULAR | Status: AC
  Filled 2018-03-06: qty 10

## 2018-03-06 MED ORDER — GABAPENTIN 300 MG PO CAPS
300.0000 mg | ORAL_CAPSULE | Freq: Every day | ORAL | Status: DC
  Administered 2018-03-06 – 2018-03-07 (×2): 300 mg via ORAL
  Filled 2018-03-06 (×2): qty 1

## 2018-03-06 MED ORDER — CEFUROXIME SODIUM 1.5 G IV SOLR
INTRAVENOUS | Status: DC | PRN
  Administered 2018-03-06: 1.5 g via INTRAVENOUS

## 2018-03-06 MED ORDER — LEVOTHYROXINE SODIUM 25 MCG PO TABS
137.0000 ug | ORAL_TABLET | Freq: Every day | ORAL | Status: DC
  Administered 2018-03-07 – 2018-03-08 (×2): 137 ug via ORAL
  Filled 2018-03-06 (×2): qty 1

## 2018-03-06 MED ORDER — ONDANSETRON HCL 4 MG/2ML IJ SOLN
INTRAMUSCULAR | Status: DC | PRN
  Administered 2018-03-06: 4 mg via INTRAVENOUS

## 2018-03-06 MED ORDER — SODIUM CHLORIDE 0.9 % IR SOLN
Status: DC | PRN
  Administered 2018-03-06: 3000 mL

## 2018-03-06 MED ORDER — PHENYLEPHRINE HCL 10 MG/ML IJ SOLN
INTRAVENOUS | Status: DC | PRN
  Administered 2018-03-06: 20 ug/min via INTRAVENOUS

## 2018-03-06 MED ORDER — DOCUSATE SODIUM 100 MG PO CAPS
100.0000 mg | ORAL_CAPSULE | Freq: Two times a day (BID) | ORAL | Status: DC
  Administered 2018-03-06 – 2018-03-08 (×4): 100 mg via ORAL
  Filled 2018-03-06 (×4): qty 1

## 2018-03-06 MED ORDER — ACETAMINOPHEN 500 MG PO TABS
1000.0000 mg | ORAL_TABLET | Freq: Four times a day (QID) | ORAL | Status: AC
  Administered 2018-03-06 – 2018-03-07 (×4): 1000 mg via ORAL
  Filled 2018-03-06 (×4): qty 2

## 2018-03-06 MED ORDER — METOCLOPRAMIDE HCL 5 MG/ML IJ SOLN
5.0000 mg | Freq: Three times a day (TID) | INTRAMUSCULAR | Status: DC | PRN
Start: 2018-03-06 — End: 2018-03-08

## 2018-03-06 MED ORDER — POVIDONE-IODINE 7.5 % EX SOLN
Freq: Once | CUTANEOUS | Status: DC
  Filled 2018-03-06: qty 118

## 2018-03-06 MED ORDER — 0.9 % SODIUM CHLORIDE (POUR BTL) OPTIME
TOPICAL | Status: DC | PRN
  Administered 2018-03-06: 1000 mL

## 2018-03-06 MED ORDER — PHENYLEPHRINE 40 MCG/ML (10ML) SYRINGE FOR IV PUSH (FOR BLOOD PRESSURE SUPPORT)
PREFILLED_SYRINGE | INTRAVENOUS | Status: DC | PRN
  Administered 2018-03-06: 80 ug via INTRAVENOUS

## 2018-03-06 MED ORDER — PROPOFOL 10 MG/ML IV BOLUS
INTRAVENOUS | Status: AC
  Filled 2018-03-06: qty 20

## 2018-03-06 MED ORDER — MENTHOL 3 MG MT LOZG
1.0000 | LOZENGE | OROMUCOSAL | Status: DC | PRN
  Filled 2018-03-06: qty 9

## 2018-03-06 SURGICAL SUPPLY — 72 items
BANDAGE ESMARK 6X9 LF (GAUZE/BANDAGES/DRESSINGS) ×1 IMPLANT
BENZOIN TINCTURE PRP APPL 2/3 (GAUZE/BANDAGES/DRESSINGS) ×3 IMPLANT
BLADE SAGITTAL 25.0X1.19X90 (BLADE) ×2 IMPLANT
BLADE SAGITTAL 25.0X1.19X90MM (BLADE) ×1
BLADE SAW SGTL 13X75X1.27 (BLADE) ×3 IMPLANT
BLADE SURG 10 STRL SS (BLADE) ×6 IMPLANT
BNDG ELASTIC 6X15 VLCR STRL LF (GAUZE/BANDAGES/DRESSINGS) ×3 IMPLANT
BNDG ESMARK 6X9 LF (GAUZE/BANDAGES/DRESSINGS) ×3
BOWL SMART MIX CTS (DISPOSABLE) ×3 IMPLANT
CAPT KNEE TOTAL 3 ATTUNE ×3 IMPLANT
CEMENT HV SMART SET (Cement) ×6 IMPLANT
CLOSURE STERI-STRIP 1/2X4 (GAUZE/BANDAGES/DRESSINGS) ×1
CLOSURE WOUND 1/2 X4 (GAUZE/BANDAGES/DRESSINGS) ×1
CLSR STERI-STRIP ANTIMIC 1/2X4 (GAUZE/BANDAGES/DRESSINGS) ×2 IMPLANT
COVER SURGICAL LIGHT HANDLE (MISCELLANEOUS) ×3 IMPLANT
CUFF TOURNIQUET SINGLE 34IN LL (TOURNIQUET CUFF) ×3 IMPLANT
CUFF TOURNIQUET SINGLE 44IN (TOURNIQUET CUFF) IMPLANT
DECANTER SPIKE VIAL GLASS SM (MISCELLANEOUS) ×3 IMPLANT
DRAPE EXTREMITY T 121X128X90 (DRAPE) ×3 IMPLANT
DRAPE HALF SHEET 40X57 (DRAPES) ×6 IMPLANT
DRAPE INCISE IOBAN 66X45 STRL (DRAPES) IMPLANT
DRAPE ORTHO SPLIT 77X108 STRL (DRAPES) ×2
DRAPE SURG ORHT 6 SPLT 77X108 (DRAPES) ×1 IMPLANT
DRAPE U-SHAPE 47X51 STRL (DRAPES) ×3 IMPLANT
DRESSING AQUACEL AG SP 3.5X10 (GAUZE/BANDAGES/DRESSINGS) ×1 IMPLANT
DRSG AQUACEL AG ADV 3.5X10 (GAUZE/BANDAGES/DRESSINGS) ×3 IMPLANT
DRSG AQUACEL AG SP 3.5X10 (GAUZE/BANDAGES/DRESSINGS) ×3
DURAPREP 26ML APPLICATOR (WOUND CARE) ×3 IMPLANT
ELECT CAUTERY BLADE 6.4 (BLADE) ×3 IMPLANT
ELECT REM PT RETURN 9FT ADLT (ELECTROSURGICAL) ×3
ELECTRODE REM PT RTRN 9FT ADLT (ELECTROSURGICAL) ×1 IMPLANT
FACESHIELD WRAPAROUND (MASK) ×3 IMPLANT
GLOVE BIO SURGEON STRL SZ7 (GLOVE) ×3 IMPLANT
GLOVE BIOGEL PI IND STRL 7.0 (GLOVE) ×1 IMPLANT
GLOVE BIOGEL PI IND STRL 7.5 (GLOVE) ×1 IMPLANT
GLOVE BIOGEL PI INDICATOR 7.0 (GLOVE) ×2
GLOVE BIOGEL PI INDICATOR 7.5 (GLOVE) ×2
GLOVE SS BIOGEL STRL SZ 7.5 (GLOVE) ×1 IMPLANT
GLOVE SUPERSENSE BIOGEL SZ 7.5 (GLOVE) ×2
GOWN STRL REUS W/ TWL LRG LVL3 (GOWN DISPOSABLE) ×1 IMPLANT
GOWN STRL REUS W/ TWL XL LVL3 (GOWN DISPOSABLE) ×1 IMPLANT
GOWN STRL REUS W/TWL LRG LVL3 (GOWN DISPOSABLE) ×2
GOWN STRL REUS W/TWL XL LVL3 (GOWN DISPOSABLE) ×2
HANDPIECE INTERPULSE COAX TIP (DISPOSABLE) ×2
HOOD PEEL AWAY FACE SHEILD DIS (HOOD) ×6 IMPLANT
IMMOBILIZER KNEE 22 (SOFTGOODS) ×3 IMPLANT
IMMOBILIZER KNEE 22 UNIV (SOFTGOODS) ×3 IMPLANT
KIT BASIN OR (CUSTOM PROCEDURE TRAY) ×3 IMPLANT
KIT TURNOVER KIT B (KITS) ×3 IMPLANT
MANIFOLD NEPTUNE II (INSTRUMENTS) ×3 IMPLANT
MARKER SKIN DUAL TIP RULER LAB (MISCELLANEOUS) ×3 IMPLANT
NEEDLE HYPO 22GX1.5 SAFETY (NEEDLE) ×6 IMPLANT
NS IRRIG 1000ML POUR BTL (IV SOLUTION) ×3 IMPLANT
PACK TOTAL JOINT (CUSTOM PROCEDURE TRAY) ×3 IMPLANT
PAD ARMBOARD 7.5X6 YLW CONV (MISCELLANEOUS) ×6 IMPLANT
SET HNDPC FAN SPRY TIP SCT (DISPOSABLE) ×1 IMPLANT
STRIP CLOSURE SKIN 1/2X4 (GAUZE/BANDAGES/DRESSINGS) ×2 IMPLANT
SUCTION FRAZIER HANDLE 10FR (MISCELLANEOUS) ×2
SUCTION TUBE FRAZIER 10FR DISP (MISCELLANEOUS) ×1 IMPLANT
SUT MNCRL AB 3-0 PS2 18 (SUTURE) ×3 IMPLANT
SUT VIC AB 0 CT1 27 (SUTURE) ×4
SUT VIC AB 0 CT1 27XBRD ANBCTR (SUTURE) ×2 IMPLANT
SUT VIC AB 1 CT1 27 (SUTURE) ×2
SUT VIC AB 1 CT1 27XBRD ANBCTR (SUTURE) ×1 IMPLANT
SUT VIC AB 2-0 CT1 27 (SUTURE) ×4
SUT VIC AB 2-0 CT1 TAPERPNT 27 (SUTURE) ×2 IMPLANT
SYR CONTROL 10ML LL (SYRINGE) ×6 IMPLANT
TOWEL OR 17X24 6PK STRL BLUE (TOWEL DISPOSABLE) ×3 IMPLANT
TOWEL OR 17X26 10 PK STRL BLUE (TOWEL DISPOSABLE) ×3 IMPLANT
TRAY CATH 16FR W/PLASTIC CATH (SET/KITS/TRAYS/PACK) IMPLANT
TRAY FOLEY CATH SILVER 16FR (SET/KITS/TRAYS/PACK) ×3 IMPLANT
WATER STERILE IRR 1000ML POUR (IV SOLUTION) ×3 IMPLANT

## 2018-03-06 NOTE — Anesthesia Procedure Notes (Signed)
Procedure Name: MAC Date/Time: 03/06/2018 9:36 AM Performed by: Candis Shine, CRNA Pre-anesthesia Checklist: Patient identified, Emergency Drugs available, Suction available, Patient being monitored and Timeout performed Patient Re-evaluated:Patient Re-evaluated prior to induction Oxygen Delivery Method: Simple face mask Dental Injury: Teeth and Oropharynx as per pre-operative assessment

## 2018-03-06 NOTE — Evaluation (Signed)
Physical Therapy Evaluation Patient Details Name: Jackie Cooke MRN: [id number] DOB: 1951/01/19 Today's Date: 03/06/2018   History of Present Illness  Pt is a 67 y/o female s/p elective L TKA. PMH includes HTN and R THA.   Clinical Impression  Pt is s/p surgery above with deficits below. Pt tolerated gait training well and required min to min guard for mobility tasks using RW. Reviewed knee precautions and supine HEP. Will continue to follow acutely to maximize functional mobility independence and safety.     Follow Up Recommendations Follow surgeon's recommendation for DC plan and follow-up therapies;Supervision for mobility/OOB    Equipment Recommendations  None recommended by PT    Recommendations for Other Services       Precautions / Restrictions Precautions Precautions: Knee Precaution Booklet Issued: Yes (comment) Precaution Comments: Reviewed supine HEP and knee precautions.  Restrictions Weight Bearing Restrictions: Yes LLE Weight Bearing: Weight bearing as tolerated      Mobility  Bed Mobility Overal bed mobility: Needs Assistance Bed Mobility: Supine to Sit     Supine to sit: Supervision     General bed mobility comments: Supervision for safety.   Transfers Overall transfer level: Needs assistance Equipment used: Rolling walker (2 wheeled) Transfers: Sit to/from Stand Sit to Stand: Min assist         General transfer comment: Min A for steadying assist. Verbal cues for safe hand placement.   Ambulation/Gait Ambulation/Gait assistance: Min guard Ambulation Distance (Feet): 50 Feet Assistive device: Rolling walker (2 wheeled) Gait Pattern/deviations: Step-to pattern;Decreased step length - right;Decreased step length - left;Decreased weight shift to left;Antalgic Gait velocity: Decreased    General Gait Details: Slow, antalgic gait. Verbal cues for sequencing using RW.   Stairs            Wheelchair Mobility    Modified Rankin (Stroke  Patients Only)       Balance Overall balance assessment: Needs assistance Sitting-balance support: No upper extremity supported;Feet supported Sitting balance-Leahy Scale: Good     Standing balance support: Bilateral upper extremity supported;During functional activity Standing balance-Leahy Scale: Poor Standing balance comment: Reliant on BUE support.                              Pertinent Vitals/Pain Pain Assessment: 0-10 Pain Score: 3  Pain Location: L knee  Pain Descriptors / Indicators: Aching;Operative site guarding Pain Intervention(s): Limited activity within patient's tolerance;Monitored during session;Repositioned    Home Living Family/patient expects to be discharged to:: Private residence Living Arrangements: Spouse/significant other Available Help at Discharge: Family;Available 24 hours/day Type of Home: House Home Access: Stairs to enter Entrance Stairs-Rails: Left Entrance Stairs-Number of Steps: 2 Home Layout: One level Home Equipment: Walker - 2 wheels;Bedside commode;Shower seat;Other (comment)(CPM )      Prior Function Level of Independence: Independent               Hand Dominance   Dominant Hand: Right    Extremity/Trunk Assessment        Lower Extremity Assessment Lower Extremity Assessment: LLE deficits/detail LLE Deficits / Details: Reports some decreased sensation. Able to perform ther ex below. Deficits consistent with post op pain and weakness.     Cervical / Trunk Assessment Cervical / Trunk Assessment: Normal  Communication   Communication: No difficulties  Cognition Arousal/Alertness: Awake/alert Behavior During Therapy: WFL for tasks assessed/performed Overall Cognitive Status: Within Functional Limits for tasks assessed  General Comments General comments (skin integrity, edema, etc.): Pt's daughter present during session.     Exercises Total Joint  Exercises Ankle Circles/Pumps: AROM;Both;20 reps Quad Sets: AROM;Left;10 reps Heel Slides: AROM;Left;10 reps   Assessment/Plan    PT Assessment Patient needs continued PT services  PT Problem List Decreased strength;Decreased balance;Decreased mobility;Decreased knowledge of use of DME;Pain;Decreased knowledge of precautions;Impaired sensation       PT Treatment Interventions DME instruction;Gait training;Functional mobility training;Stair training;Therapeutic activities;Therapeutic exercise;Balance training;Patient/family education    PT Goals (Current goals can be found in the Care Plan section)  Acute Rehab PT Goals Patient Stated Goal: to go home tomorrow  PT Goal Formulation: With patient Time For Goal Achievement: 03/20/18 Potential to Achieve Goals: Good    Frequency 7X/week   Barriers to discharge        Co-evaluation               AM-PAC PT "6 Clicks" Daily Activity  Outcome Measure Difficulty turning over in bed (including adjusting bedclothes, sheets and blankets)?: A Little Difficulty moving from lying on back to sitting on the side of the bed? : A Little Difficulty sitting down on and standing up from a chair with arms (e.g., wheelchair, bedside commode, etc,.)?: Unable Help needed moving to and from a bed to chair (including a wheelchair)?: A Little Help needed walking in hospital room?: A Little Help needed climbing 3-5 steps with a railing? : A Little 6 Click Score: 16    End of Session Equipment Utilized During Treatment: Gait belt;Right knee immobilizer Activity Tolerance: Patient tolerated treatment well Patient left: in chair;with call bell/phone within reach;with family/visitor present Nurse Communication: Mobility status PT Visit Diagnosis: Other abnormalities of gait and mobility (R26.89);Pain Pain - Right/Left: Left Pain - part of body: Knee    Time: 1703-1730 PT Time Calculation (min) (ACUTE ONLY): 27 min   Charges:   PT  Evaluation $PT Eval Low Complexity: 1 Low PT Treatments $Gait Training: 8-22 mins   PT G Codes:        Gladys Damme, PT, DPT  Acute Rehabilitation Services  Pager: [phone]   Lehman Prom 03/06/2018, 5:46 PM

## 2018-03-06 NOTE — Plan of Care (Signed)
  Problem: Education: Goal: Knowledge of General Education information will improve Outcome: Progressing   

## 2018-03-06 NOTE — Transfer of Care (Signed)
Immediate Anesthesia Transfer of Care Note  Patient: Jackie Cooke  Procedure(s) Performed: TOTAL KNEE ARTHROPLASTY (Left )  Patient Location: PACU  Anesthesia Type:Spinal and MAC combined with regional for post-op pain  Level of Consciousness: awake, alert  and oriented  Airway & Oxygen Therapy: Patient Spontanous Breathing and Patient connected to nasal cannula oxygen  Post-op Assessment: Report given to RN and Post -op Vital signs reviewed and stable  Post vital signs: Reviewed and stable  Last Vitals:  Vitals Value Taken Time  BP 110/52 03/06/2018 11:41 AM  Temp    Pulse 71 03/06/2018 11:41 AM  Resp 16 03/06/2018 11:41 AM  SpO2 96 % 03/06/2018 11:41 AM  Vitals shown include unvalidated device data.  Last Pain:  Vitals:   03/06/18 0835  TempSrc:   PainSc: 0-No pain         Complications: No apparent anesthesia complications

## 2018-03-06 NOTE — Anesthesia Procedure Notes (Signed)
Spinal  Patient location during procedure: OR Start time: 03/06/2018 9:31 AM End time: 03/06/2018 9:41 AM Staffing Anesthesiologist: Heather Roberts, MD Performed: anesthesiologist  Preanesthetic Checklist Completed: patient identified, surgical consent, pre-op evaluation, timeout performed, IV checked, risks and benefits discussed and monitors and equipment checked Spinal Block Patient position: sitting Prep: DuraPrep Patient monitoring: cardiac monitor, continuous pulse ox and blood pressure Approach: midline Location: L2-3 Injection technique: single-shot Needle Needle type: Pencan  Needle gauge: 24 G Needle length: 9 cm Additional Notes Functioning IV was confirmed and monitors were applied. Sterile prep and drape, including hand hygiene and sterile gloves were used. The patient was positioned and the spine was prepped. The skin was anesthetized with lidocaine.  Free flow of clear CSF was obtained prior to injecting local anesthetic into the CSF.  The spinal needle aspirated freely following injection.  The needle was carefully withdrawn.  The patient tolerated the procedure well.

## 2018-03-06 NOTE — Anesthesia Procedure Notes (Addendum)
Anesthesia Regional Block: Adductor canal block   Pre-Anesthetic Checklist: ,, timeout performed, Correct Patient, Correct Site, Correct Laterality, Correct Procedure, Correct Position, site marked, Risks and benefits discussed,  Surgical consent,  Pre-op evaluation,  At surgeon's request and post-op pain management  Laterality: Left  Prep: chloraprep       Needles:  Injection technique: Single-shot  Needle Type: Stimulator Needle - 80     Needle Length: 10cm  Needle Gauge: 21     Additional Needles:   Narrative:  Start time: 03/06/2018 8:28 AM End time: 03/06/2018 8:38 AM Injection made incrementally with aspirations every 5 mL.  Performed by: Personally

## 2018-03-06 NOTE — Interval H&P Note (Signed)
History and Physical Interval Note:  03/06/2018 7:02 AM  Jackie Cooke  has presented today for surgery, with the diagnosis of djd left knee  The various methods of treatment have been discussed with the patient and family. After consideration of risks, benefits and other options for treatment, the patient has consented to  Procedure(s): TOTAL KNEE ARTHROPLASTY (Left) as a surgical intervention .  The patient's history has been reviewed, patient examined, no change in status, stable for surgery.  I have reviewed the patient's chart and labs.  Questions were answered to the patient's satisfaction.     [name]

## 2018-03-06 NOTE — Anesthesia Preprocedure Evaluation (Addendum)
Anesthesia Evaluation  Patient identified by MRN, date of birth, ID band Patient awake    Reviewed: Allergy & Precautions, NPO status , Patient's Chart, lab work & pertinent test results  History of Anesthesia Complications (+) PONV and history of anesthetic complications  Airway Mallampati: II  TM Distance: >3 FB Neck ROM: Full    Dental  (+) Teeth Intact, Dental Advisory Given   Pulmonary neg pulmonary ROS,    Pulmonary exam normal        Cardiovascular hypertension, Pt. on medications Normal cardiovascular exam     Neuro/Psych negative neurological ROS  negative psych ROS   GI/Hepatic Neg liver ROS, GERD  Medicated,  Endo/Other  Hypothyroidism   Renal/GU negative Renal ROS     Musculoskeletal  (+) Arthritis ,   Abdominal   Peds  Hematology   Anesthesia Other Findings   Reproductive/Obstetrics                            Anesthesia Physical Anesthesia Plan  ASA: II  Anesthesia Plan: Spinal and MAC   Post-op Pain Management:    Induction:   PONV Risk Score and Plan: 1, 2 and 3 and Ondansetron, Dexamethasone and Propofol infusion  Airway Management Planned: Natural Airway  Additional Equipment:   Intra-op Plan:   Post-operative Plan:   Informed Consent:   Plan Discussed with:   Anesthesia Plan Comments:         Anesthesia Quick Evaluation

## 2018-03-06 NOTE — Op Note (Signed)
MRN:     [id number] DOB/AGE:    July 13, 1951 / 67 y.o.       OPERATIVE REPORT   DATE OF PROCEDURE:  03/06/2018      PREOPERATIVE DIAGNOSIS:   Primary Localized Osteoarthritis left Knee       Estimated body mass index is 27.83 kg/m as calculated from the following:   Height as of this encounter:  (1.727 m).   Weight as of this encounter: 83 kg (183 lb).                                                       POSTOPERATIVE DIAGNOSIS:   Same                                                                 PROCEDURE:  Procedure(s): TOTAL KNEE ARTHROPLASTY Using Depuy Attune RP implants #6 narrow Femur, #6Tibia, 6mm  RP bearing, 32 Patella    SURGEON: Kynadie Yaun A. Thurston Hole, MD   ASSISTANT: Julien Girt, PA-C, present and scrubbed throughout the case, critical for retraction, instrumentation, and closure.  ANESTHESIA: Spinal with Adductor Nerve Block  TOURNIQUET TIME: 65 minutes   COMPLICATIONS:  None       SPECIMENS: None   INDICATIONS FOR PROCEDURE: The patient has djd of the knee with varus deformities, XR shows bone on bone arthritis. Patient has failed all conservative measures including anti-inflammatory medicines, narcotics, attempts at exercise and weight loss, cortisone injections and viscosupplementation.  Risks and benefits of surgery have been discussed, questions answered.    DESCRIPTION OF PROCEDURE: The patient identified by armband, received right adductor canal block and IV antibiotics, in the holding area at [facility]. Patient taken to the operating room, appropriate anesthetic monitors were attached. Spinal anesthesia induced with the patient in supine position, Foley catheter was inserted. Tourniquet applied high to the operative thigh. Lateral post and foot positioner applied to the table, the lower extremity was then prepped and draped in usual sterile fashion from the ankle to the tourniquet. Time-out procedure was performed. The limb was wrapped with an  Esmarch bandage and the tourniquet inflated to 365 mmHg.   We began the operation by making a 6cm anterior midline incision. Small bleeders in the skin and the subcutaneous tissue identified and cauterized. Transverse retinaculum was incised and reflected medially and a medial parapatellar arthrotomy was accomplished. the patella was everted and theprepatellar fat pad resected. The superficial medial collateral ligament was then elevated from anterior to posterior along the proximal flare of the tibia and anterior half of the menisci resected. The knee was hyperflexed exposing bone on bone arthritis. Peripheral and notch osteophytes as well as the cruciate ligaments were then resected. We continued to work our way around posteriorly along the proximal tibia, and externally rotated the tibia subluxing it out from underneath the femur. A McHale retractor was placed through the notch and a lateral Hohmann retractor placed, and an external tibial guide was placed.  The tibial cutting guide was pinned into place allowing resection of 6 mm of bone medially and about 5 mm of bone laterally  because of her valgus deformity.   Satisfied with the tibial resection, we then entered the distal femur 2 mm anterior to the PCL origin with the intramedullary guide rod and applied the distal femoral cutting guide set at 11mm, with 5 degrees of valgus. This was pinned along the epicondylar axis. At this point, the distal femoral cut was accomplished without difficulty. We then sized for a 6 narrow femoral component and pinned the guide in 3 degrees of external rotation.The chamfer cutting guide was pinned into place. The anterior, posterior, and chamfer cuts were accomplished without difficulty followed by the  RP box cutting guide and the box cut. We also removed posterior osteophytes from the posterior femoral condyles. At this time, the knee was brought into full extension. We checked our extension and flexion gaps and found  them symmetric at 6.  The patella thickness measured at 70m m. We set the cutting guide at 15 and removed the posterior patella sized for 32 button and drilled the lollipop. The knee was then once again hyperflexed exposing the proximal tibia. We sized for a # 6 tibial base plate, applied the smokestack and the conical reamer followed by the the Delta fin keel punch. We then hammered into place the  RP trial femoral component, inserted a trial bearing, trial patellar button, and took the knee through range of motion from 0-130 degrees. No thumb pressure was required for patellar tracking.   At this point, all trial components were removed, a double batch of DePuy HV cement with  of Zinecef  was mixed and applied to all bony metallic mating surfaces. In order, we hammered into place the tibial tray and removed excess cement, the femoral component and removed excess cement, a 6 mm  RP bearing was inserted, and the knee brought to full extension with compression. The patellar button was clamped into place, and excess cement removed. While the cement cured the wound was irrigated out with normal saline solution pulse lavage, and exparel was injected throughout the knee. Ligament stability and patellar tracking were checked and found to be excellent..   The parapatellar arthrotomy was closed with  #1 Vicryl suture. The subcutaneous tissue with 0 and 2-0 undyed Vicryl suture, and 4-0 Monocryl.. A dressing of Aquaseal, 4 x 4, dressing sponges, Webril, and Ace wrap applied. Needle and sponge count were correct times 2.The patient awakened, extubated, and taken to recovery room without difficulty. Vascular status was normal, pulses 2+ and symmetric.    [name] 01/16/2018, 8:56 AM

## 2018-03-06 NOTE — Progress Notes (Signed)
Orthopedic Tech Progress Note Patient Details:  Jackie Cooke 02-Apr-1951 [id number]  CPM Left Knee CPM Left Knee: On Left Knee Flexion (Degrees): 90 Left Knee Extension (Degrees): 0 Additional Comments: FOOT ROLL  Post Interventions Patient Tolerated: Well Instructions Provided: Care of device, Adjustment of device  Saul Fordyce 03/06/2018, 12:04 PM

## 2018-03-07 ENCOUNTER — Encounter (HOSPITAL_COMMUNITY): Payer: Self-pay | Admitting: Physician Assistant

## 2018-03-07 DIAGNOSIS — M7122 Synovial cyst of popliteal space [Baker], left knee: Secondary | ICD-10-CM

## 2018-03-07 DIAGNOSIS — D72829 Elevated white blood cell count, unspecified: Secondary | ICD-10-CM | POA: Diagnosis not present

## 2018-03-07 HISTORY — DX: Synovial cyst of popliteal space (Baker), left knee: M71.22

## 2018-03-07 LAB — CBC
HCT: 36.9 % (ref 36.0–46.0)
Hemoglobin: 12.2 g/dL (ref 12.0–15.0)
MCH: 31.7 pg (ref 26.0–34.0)
MCHC: 33.1 g/dL (ref 30.0–36.0)
MCV: 95.8 fL (ref 78.0–100.0)
PLATELETS: 241 10*3/uL (ref 150–400)
RBC: 3.85 MIL/uL — AB (ref 3.87–5.11)
RDW: 14.7 % (ref 11.5–15.5)
WBC: 16.1 10*3/uL — ABNORMAL HIGH (ref 4.0–10.5)

## 2018-03-07 LAB — BASIC METABOLIC PANEL
Anion gap: 10 (ref 5–15)
BUN: 11 mg/dL (ref 6–20)
CO2: 26 mmol/L (ref 22–32)
Calcium: 8.6 mg/dL — ABNORMAL LOW (ref 8.9–10.3)
Chloride: 103 mmol/L (ref 101–111)
Creatinine, Ser: 0.71 mg/dL (ref 0.44–1.00)
GFR calc Af Amer: >60 mL/min (ref >60)
GFR calc non Af Amer: >60 mL/min (ref >60)
GLUCOSE: 172 mg/dL — AB (ref 65–99)
POTASSIUM: 4.8 mmol/L (ref 3.5–5.1)
Sodium: 139 mmol/L (ref 135–145)

## 2018-03-07 MED ORDER — CEFAZOLIN SODIUM-DEXTROSE 2-4 GM/100ML-% IV SOLN
2.0000 g | Freq: Three times a day (TID) | INTRAVENOUS | Status: DC
  Administered 2018-03-07 – 2018-03-08 (×5): 2 g via INTRAVENOUS
  Filled 2018-03-07 (×6): qty 100

## 2018-03-07 NOTE — Progress Notes (Signed)
Physical Therapy Treatment Patient Details Name: Jackie Cooke MRN: [id number] DOB: 04-17-1951 Today's Date: 03/07/2018    History of Present Illness Pt is a 67 y/o female s/p elective L TKA. PMH includes HTN and R THA.     PT Comments    Pt performed gait training, stair training and reviewed HEP.  More pain noted with gait this pm.  Pt tolerated session well.  Plan to review stair training in the am.     Follow Up Recommendations  Follow surgeon's recommendation for DC plan and follow-up therapies;Supervision for mobility/OOB     Equipment Recommendations  None recommended by PT    Recommendations for Other Services       Precautions / Restrictions Precautions Precautions: Knee Precaution Booklet Issued: Yes (comment) Precaution Comments: Reviewed supine HEP and knee precautions.  Restrictions Weight Bearing Restrictions: Yes LLE Weight Bearing: Weight bearing as tolerated    Mobility  Bed Mobility Overal bed mobility: Modified Independent Bed Mobility: Supine to Sit;Sit to Supine     Supine to sit: Modified independent (Device/Increase time) Sit to supine: Modified independent (Device/Increase time)   General bed mobility comments: Supervision for safety.   Transfers Overall transfer level: Needs assistance Equipment used: Rolling walker (2 wheeled) Transfers: Sit to/from Stand Sit to Stand: Supervision         General transfer comment: Supervision, slow to ascend due to pain but no assistance needed.    Ambulation/Gait Ambulation/Gait assistance: Supervision;Min guard(min guard initially as patient is guarded due to pain.  ) Ambulation Distance (Feet): 350 Feet Assistive device: Rolling walker (2 wheeled) Gait Pattern/deviations: Decreased step length - right;Decreased step length - left;Decreased weight shift to left;Antalgic;Step-through pattern Gait velocity: Decreased    General Gait Details: Pt slightly antalgic, cues to relax shoulders and to  improve gait symmetry with increased weight bearing on L.     Stairs Stairs: Yes Stairs assistance: Supervision Stair Management: One rail Left;Forwards;Sideways Number of Stairs: 2 General stair comments: Cues for sequencing, forwards to ascend and sideways to descend.     Wheelchair Mobility    Modified Rankin (Stroke Patients Only)       Balance Overall balance assessment: Needs assistance Sitting-balance support: No upper extremity supported;Feet supported Sitting balance-Leahy Scale: Good     Standing balance support: Bilateral upper extremity supported;During functional activity Standing balance-Leahy Scale: Fair Standing balance comment: Reliant on BUE support.                             Cognition Arousal/Alertness: Awake/alert Behavior During Therapy: WFL for tasks assessed/performed Overall Cognitive Status: Within Functional Limits for tasks assessed                                        Exercises Total Joint Exercises Ankle Circles/Pumps: AROM;Both;20 reps;Supine Quad Sets: AROM;Left;10 reps;Supine Towel Squeeze: AROM;Both;10 reps;Supine Short Arc Quad: AROM;Left;10 reps;Supine Heel Slides: AROM;Left;10 reps Hip ABduction/ADduction: AROM;Left;10 reps;Supine Straight Leg Raises: AROM;Left;10 reps;Supine Goniometric ROM: 89 degrees L knee flexion.      General Comments        Pertinent Vitals/Pain Pain Assessment: 0-10 Pain Score: 6  Pain Location: L knee (lateral posterior aspect) Pain Descriptors / Indicators: Aching;Operative site guarding Pain Intervention(s): Monitored during session;Repositioned;Ice applied    Home Living  Prior Function            PT Goals (current goals can now be found in the care plan section) Acute Rehab PT Goals Patient Stated Goal: to go home tomorrow  Potential to Achieve Goals: Good Progress towards PT goals: Progressing toward goals     Frequency    7X/week      PT Plan Current plan remains appropriate    Co-evaluation              AM-PAC PT "6 Clicks" Daily Activity  Outcome Measure  Difficulty turning over in bed (including adjusting bedclothes, sheets and blankets)?: None Difficulty moving from lying on back to sitting on the side of the bed? : None Difficulty sitting down on and standing up from a chair with arms (e.g., wheelchair, bedside commode, etc,.)?: Unable Help needed moving to and from a bed to chair (including a wheelchair)?: A Little Help needed walking in hospital room?: A Little Help needed climbing 3-5 steps with a railing? : A Little 6 Click Score: 18    End of Session Equipment Utilized During Treatment: Gait belt Activity Tolerance: Patient tolerated treatment well Patient left: in chair;with call bell/phone within reach;with family/visitor present Nurse Communication: Mobility status PT Visit Diagnosis: Other abnormalities of gait and mobility (R26.89);Pain Pain - Right/Left: Left Pain - part of body: Knee     Time: 4098-1191 PT Time Calculation (min) (ACUTE ONLY): 23 min  Charges:  $Gait Training: 8-22 mins $Therapeutic Exercise: 8-22 mins                    G Codes:       Joycelyn Rua, PTA pager [phone]    Florestine Avers 03/07/2018, 4:32 PM

## 2018-03-07 NOTE — Progress Notes (Signed)
Physical Therapy Treatment Patient Details Name: Jackie Cooke MRN: [id number] DOB: August 28, 1951 Today's Date: 03/07/2018    History of Present Illness Pt is a 67 y/o female s/p elective L TKA. PMH includes HTN and R THA.     PT Comments    Pt performed increased gait and tolerated therapeutic exercise.  Pt placed in CPM post session per request.  Plan for stair training this pm.  Pt is progressing well.   Follow Up Recommendations  Follow surgeon's recommendation for DC plan and follow-up therapies;Supervision for mobility/OOB     Equipment Recommendations  None recommended by PT    Recommendations for Other Services       Precautions / Restrictions Precautions Precautions: Knee Precaution Booklet Issued: Yes (comment) Precaution Comments: Reviewed supine HEP and knee precautions.  Restrictions Weight Bearing Restrictions: Yes LLE Weight Bearing: Weight bearing as tolerated    Mobility  Bed Mobility Overal bed mobility: Needs Assistance Bed Mobility: Supine to Sit;Sit to Supine     Supine to sit: Supervision Sit to supine: Supervision   General bed mobility comments: Supervision for safety.   Transfers Overall transfer level: Needs assistance Equipment used: Rolling walker (2 wheeled) Transfers: Sit to/from Stand Sit to Stand: Supervision         General transfer comment: Supervision, slow to ascend due to pauin but assistance needed.    Ambulation/Gait Ambulation/Gait assistance: Supervision Ambulation Distance (Feet): 350 Feet Assistive device: Rolling walker (2 wheeled) Gait Pattern/deviations: Decreased step length - right;Decreased step length - left;Decreased weight shift to left;Antalgic;Step-through pattern Gait velocity: Decreased    General Gait Details: Pt with improved speed and progression to step through pattern.     Stairs             Wheelchair Mobility    Modified Rankin (Stroke Patients Only)       Balance Overall  balance assessment: Needs assistance Sitting-balance support: No upper extremity supported;Feet supported Sitting balance-Leahy Scale: Good     Standing balance support: Bilateral upper extremity supported;During functional activity Standing balance-Leahy Scale: Fair                              Cognition Arousal/Alertness: Awake/alert Behavior During Therapy: WFL for tasks assessed/performed Overall Cognitive Status: Within Functional Limits for tasks assessed                                        Exercises Total Joint Exercises Ankle Circles/Pumps: AROM;Both;20 reps;Supine Quad Sets: AROM;Left;10 reps;Supine Short Arc Quad: AROM;Left;10 reps;Supine Heel Slides: AROM;Left;10 reps Hip ABduction/ADduction: AROM;Left;10 reps;Supine Straight Leg Raises: AROM;Left;10 reps;Supine Goniometric ROM: 89 degrees L knee flexion.      General Comments        Pertinent Vitals/Pain Pain Assessment: 0-10 Pain Score: 6  Pain Location: L knee (lateral posterior aspect) Pain Descriptors / Indicators: Aching;Operative site guarding Pain Intervention(s): Monitored during session;Repositioned;Ice applied    Home Living                      Prior Function            PT Goals (current goals can now be found in the care plan section) Acute Rehab PT Goals Patient Stated Goal: to go home tomorrow  Potential to Achieve Goals: Good Progress towards PT goals: Progressing toward goals  Frequency    7X/week      PT Plan Current plan remains appropriate    Co-evaluation              AM-PAC PT "6 Clicks" Daily Activity  Outcome Measure  Difficulty turning over in bed (including adjusting bedclothes, sheets and blankets)?: None Difficulty moving from lying on back to sitting on the side of the bed? : None Difficulty sitting down on and standing up from a chair with arms (e.g., wheelchair, bedside commode, etc,.)?: Unable Help needed  moving to and from a bed to chair (including a wheelchair)?: A Little Help needed walking in hospital room?: A Little Help needed climbing 3-5 steps with a railing? : A Little 6 Click Score: 18    End of Session Equipment Utilized During Treatment: Gait belt Activity Tolerance: Patient tolerated treatment well Patient left: in chair;with call bell/phone within reach;with family/visitor present Nurse Communication: Mobility status PT Visit Diagnosis: Other abnormalities of gait and mobility (R26.89);Pain Pain - Right/Left: Left Pain - part of body: Knee     Time: 1200-1225 PT Time Calculation (min) (ACUTE ONLY): 25 min  Charges:  $Gait Training: 8-22 mins $Therapeutic Exercise: 8-22 mins                    G Codes:       Joycelyn Rua, PTA pager [phone]    Florestine Avers 03/07/2018, 1:49 PM

## 2018-03-07 NOTE — Care Management Note (Signed)
Case Management Note  Patient Details  Name: Jackie Cooke MRN: [id number] Date of Birth: [birth year]  Subjective/Objective: 67 yr old female s/p left total knee arthroplasty.                  Action/Plan: Case manager spoke with patient concerning discharge plan and DME. Patient was preoperatively setup with [facility], no changes. [employer] has delivered DME to her home. Patient will have support at discharge.    Expected Discharge Date:                  Expected Discharge Plan:  Home w Home Health Services  In-House Referral:  NA  Discharge planning Services  CM Consult  Post Acute Care Choice:  Durable Medical Equipment, Home Health Choice offered to:  Patient  DME Arranged:  3-N-1, CPM, Walker rolling DME Agency:  [employer]  HH Arranged:  PT HH Agency:  [facility]  Status of Service:  Completed, signed off  If discussed at [employer] [facility] [employer], dates discussed:    Additional Comments:  Durenda Guthrie, RN 03/07/2018, 11:32 AM

## 2018-03-07 NOTE — Progress Notes (Addendum)
20.15. Pt developed generalized rash, mostly on her chest and neck. Pt is negative for SOB, chest pain; vital signs are stable. Benadryl 25 mg was given. MD was paged. No orders were given for now.

## 2018-03-07 NOTE — Anesthesia Postprocedure Evaluation (Signed)
Anesthesia Post Note  Patient: Jackie Cooke  Procedure(s) Performed: TOTAL KNEE ARTHROPLASTY (Left )     Patient location during evaluation: PACU Anesthesia Type: MAC and Spinal Level of consciousness: awake and alert Pain management: pain level controlled Vital Signs Assessment: post-procedure vital signs reviewed and stable Respiratory status: spontaneous breathing and respiratory function stable Cardiovascular status: blood pressure returned to baseline and stable Postop Assessment: no headache, no backache, spinal receding and no apparent nausea or vomiting Anesthetic complications: no    Last Vitals:  Vitals:   03/07/18 0100 03/07/18 0612  BP:  137/77  Pulse:  62  Resp:    Temp:  36.8 C  SpO2: 96% 94%    Last Pain:  Vitals:   03/07/18 0612  TempSrc: Oral  PainSc:    Pain Goal: Patients Stated Pain Goal: 3 (03/07/18 0505)               Montez Hageman

## 2018-03-07 NOTE — Progress Notes (Signed)
Subjective: 1 Day Post-Op Procedure(s) (LRB): TOTAL KNEE ARTHROPLASTY (Left) Patient reports pain as 5 on 0-10 scale.    Objective: Vital signs in last 24 hours: Temp:  [97.6 F (36.4 C)-98.3 F (36.8 C)] 98.3 F (36.8 C) (05/14 0612) Pulse Rate:  [62-83] 62 (05/14 0612) Resp:  [12-19] 17 (05/14 0002) BP: (108-141)/(52-79) 137/77 (05/14 0612) SpO2:  [91 %-99 %] 94 % (05/14 0612)  Intake/Output from previous day: 05/13 0701 - 05/14 0700 In: 1000 [I.V.:1000] Out: 2100 [Urine:2050; Blood:50] Intake/Output this shift: No intake/output data recorded.  Recent Labs    03/07/18 0332  HGB 12.2   Recent Labs    03/07/18 0332  WBC 16.1*  RBC 3.85*  HCT 36.9  PLT 241   Recent Labs    03/07/18 0332  NA 139  K 4.8  CL 103  CO2 26  BUN 11  CREATININE 0.71  GLUCOSE 172*  CALCIUM 8.6*   No results for input(s): LABPT, INR in the last 72 hours.  ABD soft Neurovascular intact Sensation intact distally Intact pulses distally Dorsiflexion/Plantar flexion intact Incision: dressing C/D/I  Anticipated LOS equal to or greater than 2 midnights due to - Age 81 and older with one or more of the following:  - Obesity  - Expected need for hospital services (PT, OT, Nursing) required for safe  discharge  - Anticipated need for postoperative skilled nursing care or inpatient rehab   OR   - Unanticipated findings during/Post Surgery: Lab abnormalities     Assessment/Plan: 1 Day Post-Op Procedure(s) (LRB): TOTAL KNEE ARTHROPLASTY (Left)  Principal Problem:   Primary localized osteoarthritis of left knee Active Problems:   Essential hypertension, benign   Hypothyroidism  Will restart Ancef in the setting of Leukocytosis with history of larger baker's cyst in that knee that has under gone multiple recent aspirations and injections as well as second surgery on that knee within a year.  Continue PT     Pascal Lux 03/07/2018, 8:17 AM

## 2018-03-07 NOTE — Progress Notes (Signed)
Paged MD *2 regarding the rash that is not improving. Per PA Mellody Dance "administer Ancef, if the pt's condition gets worse - stop the antibiotic and call back." RN will continue to monitor.

## 2018-03-08 LAB — BASIC METABOLIC PANEL
ANION GAP: 8 (ref 5–15)
BUN: 11 mg/dL (ref 6–20)
CALCIUM: 8.8 mg/dL — AB (ref 8.9–10.3)
CHLORIDE: 102 mmol/L (ref 101–111)
CO2: 28 mmol/L (ref 22–32)
CREATININE: 0.68 mg/dL (ref 0.44–1.00)
GFR calc non Af Amer: >60 mL/min (ref >60)
GLUCOSE: 131 mg/dL — AB (ref 65–99)
Potassium: 4.8 mmol/L (ref 3.5–5.1)
Sodium: 138 mmol/L (ref 135–145)

## 2018-03-08 LAB — URINALYSIS, ROUTINE W REFLEX MICROSCOPIC
Bilirubin Urine: NEGATIVE
GLUCOSE, UA: NEGATIVE mg/dL
Hgb urine dipstick: NEGATIVE
Ketones, ur: NEGATIVE mg/dL
LEUKOCYTES UA: NEGATIVE
NITRITE: NEGATIVE
PH: 5 (ref 5.0–8.0)
Protein, ur: NEGATIVE mg/dL
SPECIFIC GRAVITY, URINE: 1.008 (ref 1.005–1.030)

## 2018-03-08 LAB — CBC
HEMATOCRIT: 35.4 % — AB (ref 36.0–46.0)
HEMOGLOBIN: 11.5 g/dL — AB (ref 12.0–15.0)
MCH: 31.3 pg (ref 26.0–34.0)
MCHC: 32.5 g/dL (ref 30.0–36.0)
MCV: 96.2 fL (ref 78.0–100.0)
Platelets: 266 10*3/uL (ref 150–400)
RBC: 3.68 MIL/uL — ABNORMAL LOW (ref 3.87–5.11)
RDW: 14.3 % (ref 11.5–15.5)
WBC: 15.9 10*3/uL — ABNORMAL HIGH (ref 4.0–10.5)

## 2018-03-08 MED ORDER — HYDROXYZINE HCL 25 MG PO TABS
50.0000 mg | ORAL_TABLET | Freq: Once | ORAL | Status: AC
  Administered 2018-03-08: 50 mg via ORAL
  Filled 2018-03-08: qty 2

## 2018-03-08 MED ORDER — POLYETHYLENE GLYCOL 3350 17 G PO PACK: PACK | ORAL | 0 refills | Status: DC

## 2018-03-08 MED ORDER — OXYCODONE HCL 5 MG PO TABS: ORAL_TABLET | ORAL | 0 refills | Status: DC

## 2018-03-08 MED ORDER — GABAPENTIN 300 MG PO CAPS: 300.0000 mg | ORAL_CAPSULE | Freq: Every day | ORAL | 0 refills | Status: DC

## 2018-03-08 MED ORDER — ASPIRIN 325 MG PO TBEC: DELAYED_RELEASE_TABLET | ORAL | 0 refills | Status: DC

## 2018-03-08 MED ORDER — DOCUSATE SODIUM 100 MG PO CAPS: ORAL_CAPSULE | ORAL | 0 refills | Status: DC

## 2018-03-08 MED ORDER — CEPHALEXIN 500 MG PO CAPS: 500.0000 mg | ORAL_CAPSULE | Freq: Four times a day (QID) | ORAL | 0 refills | Status: AC

## 2018-03-08 MED ORDER — HYDROXYZINE HCL 25 MG PO TABS: 25.0000 mg | ORAL_TABLET | Freq: Three times a day (TID) | ORAL | Status: DC | PRN

## 2018-03-08 NOTE — Discharge Summary (Signed)
Patient ID: Jackie Cooke MRN: [id number] DOB/AGE: [birth year] 67 y.o.  Admit date: 03/06/2018 Discharge date: 03/08/2018  Admission Diagnoses:  Principal Problem:   Primary localized osteoarthritis of left knee Active Problems:   Essential hypertension, benign   Hypothyroidism   Leukocytosis   Baker's cyst of knee, left   Discharge Diagnoses:  Same  Past Medical History:  Diagnosis Date  . Baker's cyst of knee, left 03/07/2018  . Essential hypertension, benign 02/20/2018  . GERD (gastroesophageal reflux disease)    at times takes over the counter meds   . Hypertension   . Hypothyroidism 02/20/2018  . PONV (postoperative nausea and vomiting)   . Primary localized osteoarthritis of left knee 02/20/2018  . Thyroid disease     Surgeries: Procedure(s): TOTAL KNEE ARTHROPLASTY on 03/06/2018   Consultants:   Discharged Condition: Improved  Hospital Course: Jackie Cooke is an 67 y.o. female who was admitted 03/06/2018 for operative treatment ofPrimary localized osteoarthritis of left knee. Patient has severe unremitting pain that affects sleep, daily activities, and work/hobbies. After pre-op clearance the patient was taken to the operating room on 03/06/2018 and underwent  Procedure(s): TOTAL KNEE ARTHROPLASTY.    Patient was given perioperative antibiotics:  Anti-infectives (From admission, onward)   Start     Dose/Rate Route Frequency Ordered Stop   03/08/18 0000  cephALEXin (KEFLEX) 500 MG capsule     500 mg Oral 4 times daily 03/08/18 1244 03/18/18 2359   03/07/18 0830  ceFAZolin (ANCEF) IVPB 2g/100 mL premix     2 g 200 mL/hr over 30 Minutes Intravenous Every 8 hours 03/07/18 0817     03/06/18 1600  ceFAZolin (ANCEF) IVPB 2g/100 mL premix     2 g 200 mL/hr over 30 Minutes Intravenous Every 6 hours 03/06/18 1353 03/06/18 2207   03/06/18 1011  cefUROXime (ZINACEF) injection  Status:  Discontinued       As needed 03/06/18 1011 03/06/18 1141   03/06/18 0730   ceFAZolin (ANCEF) IVPB 2g/100 mL premix     2 g 200 mL/hr over 30 Minutes Intravenous To ShortStay Surgical 03/03/18 0950 03/06/18 0941       Patient was given sequential compression devices, early ambulation, and chemoprophylaxis to prevent DVT.  Patient benefited maximally from hospital stay and there were no complications.    Recent vital signs:  Patient Vitals for the past 24 hrs:  BP Temp Temp src Pulse Resp SpO2  03/08/18 0344 140/74 97.8 F (36.6 C) Oral 66 - 95 %  03/07/18 1954 137/83 97.7 F (36.5 C) Oral 69 - 96 %  03/07/18 1304 (!) 153/78 98.1 F (36.7 C) Oral 84 16 95 %     Recent laboratory studies:  Recent Labs    03/07/18 0332 03/08/18 0338  WBC 16.1* 15.9*  HGB 12.2 11.5*  HCT 36.9 35.4*  PLT 241 266  NA 139 138  K 4.8 4.8  CL 103 102  CO2 26 28  BUN 11 11  CREATININE 0.71 0.68  GLUCOSE 172* 131*  CALCIUM 8.6* 8.8*     Discharge Medications:   Allergies as of 03/08/2018      Reactions   Morphine And Related Anaphylaxis, Shortness Of Breath   Celebrex [celecoxib] Hives      Medication List    STOP taking these medications   estradiol 1 MG tablet Commonly known as:  ESTRACE   losartan-hydrochlorothiazide 50-12.5 MG tablet Commonly known as:  HYZAAR     TAKE these medications   aspirin  325 MG EC tablet 1 tab a day for the next 30 days to prevent blood clots   Biotin 5 MG Tabs Take 1 tablet by mouth daily.   cephALEXin 500 MG capsule Commonly known as:  KEFLEX Take 1 capsule (500 mg total) by mouth 4 (four) times daily for 10 days.   cholecalciferol 1000 units tablet Commonly known as:  VITAMIN D Take 1,000 Units by mouth daily.   docusate sodium 100 MG capsule Commonly known as:  COLACE 1 tab 2 times a day while on narcotics.  STOOL SOFTENER   escitalopram 10 MG tablet Commonly known as:  LEXAPRO Take 10 mg by mouth.   gabapentin 300 MG capsule Commonly known as:  NEURONTIN Take 1 capsule (300 mg total) by mouth at  bedtime.   levothyroxine 137 MCG tablet Commonly known as:  SYNTHROID, LEVOTHROID Take 137 mcg by mouth daily before breakfast.   NIFEdipine 30 MG 24 hr tablet Commonly known as:  PROCARDIA-XL/ADALAT-CC/NIFEDICAL-XL Take 30 mg by mouth daily.   oxybutynin 5 MG 24 hr tablet Commonly known as:  DITROPAN-XL Take 5 mg by mouth daily.   oxyCODONE 5 MG immediate release tablet Commonly known as:  Oxy IR/ROXICODONE 1 po q 4 hrs prn pain.  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  LAXITIVE.  Restart if two days since last bowel movement   predniSONE 5 MG tablet Commonly known as:  DELTASONE Take 5 mg by mouth daily with breakfast.            Discharge Care Instructions  (From admission, onward)        Start     Ordered   03/08/18 0000  Change dressing    Comments:  DO NOT REMOVE BANDAGE OVER SURGICAL INCISION.  WASH WHOLE LEG INCLUDING OVER THE WATERPROOF BANDAGE WITH SOAP AND WATER EVERY DAY.   03/08/18 1244      Diagnostic Studies: No results found.  Disposition: Discharge disposition: 01-Home or Self Care       Discharge Instructions    CPM   Complete by:  As directed    Continuous passive motion machine (CPM):      Use the CPM from 0 to 90 for 6 hours per day.       You may break it up into 2 or 3 sessions per day.      Use CPM for 2 weeks or until you are told to stop.   Call MD / Call 911   Complete by:  As directed    If you experience chest pain or shortness of breath, CALL 911 and be transported to the hospital emergency room.  If you develope a fever above 101 F, pus (white drainage) or increased drainage or redness at the wound, or calf pain, call your surgeon's office.   Change dressing   Complete by:  As directed    DO NOT REMOVE BANDAGE OVER SURGICAL INCISION.  WASH WHOLE LEG INCLUDING OVER THE  WATERPROOF BANDAGE WITH SOAP AND WATER EVERY DAY.   Constipation Prevention   Complete by:  As directed    Drink plenty of fluids.  Prune juice may be helpful.  You may use a stool softener, such as Colace (over the counter) 100 mg twice a day.  Use MiraLax (over the counter) for constipation as needed.   Diet -  low sodium heart healthy   Complete by:  As directed    Discharge instructions   Complete by:  As directed    INSTRUCTIONS AFTER JOINT REPLACEMENT   Remove items at home which could result in a fall. This includes throw rugs or furniture in walking pathways ICE to the affected joint every three hours while awake for 30 minutes at a time, for at least the first 3-5 days, and then as needed for pain and swelling.  Continue to use ice for pain and swelling. You may notice swelling that will progress down to the foot and ankle.  This is normal after surgery.  Elevate your leg when you are not up walking on it.   Continue to use the breathing machine you got in the hospital (incentive spirometer) which will help keep your temperature down.  It is common for your temperature to cycle up and down following surgery, especially at night when you are not up moving around and exerting yourself.  The breathing machine keeps your lungs expanded and your temperature down.   DIET:  As you were doing prior to hospitalization, we recommend a well-balanced diet.  DRESSING / WOUND CARE / SHOWERING  Keep the surgical dressing until follow up.  The dressing is water proof, so you can shower without any extra covering.  IF THE DRESSING FALLS OFF or the wound gets wet inside, change the dressing with sterile gauze.  Please use good hand washing techniques before changing the dressing.  Do not use any lotions or creams on the incision until instructed by your surgeon.    ACTIVITY  Increase activity slowly as tolerated, but follow the weight bearing instructions below.   No driving for 6 weeks or until  further direction given by your physician.  You cannot drive while taking narcotics.  No lifting or carrying greater than 10 lbs. until further directed by your surgeon. Avoid periods of inactivity such as sitting longer than an hour when not asleep. This helps prevent blood clots.  You may return to work once you are authorized by your doctor.     WEIGHT BEARING   Weight bearing as tolerated with assist device (walker, cane, etc) as directed, use it as long as suggested by your surgeon or therapist, typically at least 2-3 weeks.   EXERCISES  Results after joint replacement surgery are often greatly improved when you follow the exercise, range of motion and muscle strengthening exercises prescribed by your doctor. Safety measures are also important to protect the joint from further injury. Any time any of these exercises cause you to have increased pain or swelling, decrease what you are doing until you are comfortable again and then slowly increase them. If you have problems or questions, call your caregiver or physical therapist for advice.   Rehabilitation is important following a joint replacement. After just a few days of immobilization, the muscles of the leg can become weakened and shrink (atrophy).  These exercises are designed to build up the tone and strength of the thigh and leg muscles and to improve motion. Often times heat used for twenty to thirty minutes before working out will loosen up your tissues and help with improving the range of motion but do not use heat for the first two weeks following surgery (sometimes heat can increase post-operative swelling).   These exercises can be done on a training (exercise) mat, on the floor, on a table or on a bed. Use whatever works the best and  is most comfortable for you.    Use music or television while you are exercising so that the exercises are a pleasant break in your day. This will make your life better with the exercises acting as a  break in your routine that you can look forward to.   Perform all exercises about fifteen times, three times per day or as directed.  You should exercise both the operative leg and the other leg as well.   Exercises include:  Quad Sets - Tighten up the muscle on the front of the thigh (Quad) and hold for 5-10 seconds.   Straight Leg Raises - With your knee straight (if you were given a brace, keep it on), lift the leg to 60 degrees, hold for 3 seconds, and slowly lower the leg.  Perform this exercise against resistance later as your leg gets stronger.  Leg Slides: Lying on your back, slowly slide your foot toward your buttocks, bending your knee up off the floor (only go as far as is comfortable). Then slowly slide your foot back down until your leg is flat on the floor again.  Angel Wings: Lying on your back spread your legs to the side as far apart as you can without causing discomfort.  Hamstring Strength:  Lying on your back, push your heel against the floor with your leg straight by tightening up the muscles of your buttocks.  Repeat, but this time bend your knee to a comfortable angle, and push your heel against the floor.  You may put a pillow under the heel to make it more comfortable if necessary.   A rehabilitation program following joint replacement surgery can speed recovery and prevent re-injury in the future due to weakened muscles. Contact your doctor or a physical therapist for more information on knee rehabilitation.    CONSTIPATION  Constipation is defined medically as fewer than three stools per week and severe constipation as less than one stool per week.  Even if you have a regular bowel pattern at home, your normal regimen is likely to be disrupted due to multiple reasons following surgery.  Combination of anesthesia, postoperative narcotics, change in appetite and fluid intake all can affect your bowels.   YOU MUST use at least one of the following options; they are listed in  order of increasing strength to get the job done.  They are all available over the counter, and you may need to use some, POSSIBLY even all of these options:    Drink plenty of fluids (prune juice may be helpful) and high fiber foods Colace 100 mg by mouth twice a day  Senokot for constipation as directed and as needed Dulcolax (bisacodyl), take with full glass of water  Miralax (polyethylene glycol) once or twice a day as needed.  If you have tried all these things and are unable to have a bowel movement in the first 3-4 days after surgery call either your surgeon or your primary doctor.    If you experience loose stools or diarrhea, hold the medications until you stool forms back up.  If your symptoms do not get better within 1 week or if they get worse, check with your doctor.  If you experience "the worst abdominal pain ever" or develop nausea or vomiting, please contact the office immediately for further recommendations for treatment.   ITCHING:  If you experience itching with your medications, try taking only a single pain pill, or even half a pain pill at a time.  You can also use Benadryl over the counter for itching or also to help with sleep.   TED HOSE STOCKINGS:  Use stockings on both legs until for at least 2 weeks or as directed by physician office. They may be removed at night for sleeping.  MEDICATIONS:  See your medication summary on the "After Visit Summary" that nursing will review with you.  You may have some home medications which will be placed on hold until you complete the course of blood thinner medication.  It is important for you to complete the blood thinner medication as prescribed.  PRECAUTIONS:  If you experience chest pain or shortness of breath - call 911 immediately for transfer to the hospital emergency department.   If you develop a fever greater that 101 F, purulent drainage from wound, increased redness or drainage from wound, foul odor from the  wound/dressing, or calf pain - CONTACT YOUR SURGEON.                                                   FOLLOW-UP APPOINTMENTS:  If you do not already have a post-op appointment, please call the office for an appointment to be seen by your surgeon.  Guidelines for how soon to be seen are listed in your "After Visit Summary", but are typically between 1-4 weeks after surgery.  OTHER INSTRUCTIONS:   Knee Replacement:  Do not place pillow under knee, focus on keeping the knee straight while resting. CPM instructions: 0-90 degrees, 2 hours in the morning, 2 hours in the afternoon, and 2 hours in the evening. Place foam block, curve side up under heel at all times except when in CPM or when walking.  DO NOT modify, tear, cut, or change the foam block in any way.  MAKE SURE YOU:  Understand these instructions.  Get help right away if you are not doing well or get worse.    Thank you for letting us be a part of your medical care team.  It is a privilege we respect greatly.  We hope these instructions will help you stay on track for a fast and full recovery!   Do not put a pillow under the knee. Place it under the heel.   Complete by:  As directed    Place gray foam block, curve side up under heel at all times except when in CPM or when walking.  DO NOT modify, tear, cut, or change in any way the gray foam block.   Increase activity slowly as tolerated   Complete by:  As directed    Patient may shower   Complete by:  As directed    Aquacel dressing is water proof    Wash over it and the whole leg with soap and water at the end of your shower   TED hose   Complete by:  As directed    Use stockings (TED hose) for 2 weeks on both leg(s).  You may remove them at night for sleeping.      Follow-up Information    [facility] Follow up.   Specialty:  Home Health Services Why:  A representative from [facility] will contact you to arrange start date and time for your therapy. Contact  information: [address] [place] [address] [place] [address] [phone]  Salvatore Marvel, MD Follow up on 03/21/2018.   Specialty:  Orthopedic Surgery Why:  appointment time 10 am Contact information: [address] [place] [address] [phone]        [facility] Follow up on 03/21/2018.   Specialty:  Orthopedic Surgery Why:  physical therapy appointment at 11 am with Katie at Dr Sherene Sires office Contact information: [address] [place] [address] [phone]            Signed: Pascal Lux 03/08/2018, 12:46 PM

## 2018-03-08 NOTE — Plan of Care (Signed)
  Problem: Health Behavior/Discharge Planning: Goal: Ability to manage health-related needs will improve Outcome: Completed/Met   Problem: Clinical Measurements: Goal: Ability to maintain clinical measurements within normal limits will improve Outcome: Completed/Met Goal: Will remain free from infection Outcome: Completed/Met Goal: Diagnostic test results will improve Outcome: Completed/Met Goal: Respiratory complications will improve Outcome: Completed/Met Goal: Cardiovascular complication will be avoided Outcome: Completed/Met   Problem: Coping: Goal: Level of anxiety will decrease Outcome: Completed/Met   Problem: Elimination: Goal: Will not experience complications related to bowel motility Outcome: Completed/Met Goal: Will not experience complications related to urinary retention Outcome: Completed/Met   Problem: Safety: Goal: Ability to remain free from injury will improve Outcome: Completed/Met   Problem: Skin Integrity: Goal: Risk for impaired skin integrity will decrease Outcome: Completed/Met

## 2018-03-08 NOTE — Care Plan (Signed)
Signed        Spoke with patient prior to surgery. She is planning to discharge to home with family and HHPT provided by [facility]. She has all needed equipment, including CPM that has already been delivered. Her follow up appointment with Dr Thurston Hole will be 03/21/18 @ 1000 She will transition to OPPT following that appointment at [facility] st.  She is in agreement with this plan.   Please contact Renee Angiulli, RNCM with questions or if the above plan needs to change. [phone]   Patient seen on rounds this am and is in agreement with above plan

## 2018-03-08 NOTE — Progress Notes (Signed)
Physical Therapy Treatment Patient Details Name: Jackie Cooke MRN: [id number] DOB: [birth year] Today's Date: 03/08/2018    History of Present Illness Pt is a 67 y/o female s/p elective L TKA. PMH includes HTN and R THA.     PT Comments    Patient progressing with therapy this visit. Ambulating further distance with less assistance needed, now supervision.  Session focused on stair training, pt with improving mechanics, will benefit from another pt visit to ensure comfort before safe return home this afternoon.     Follow Up Recommendations  Follow surgeon's recommendation for DC plan and follow-up therapies;Supervision for mobility/OOB     Equipment Recommendations  None recommended by PT    Recommendations for Other Services       Precautions / Restrictions Precautions Precautions: Knee Precaution Booklet Issued: Yes (comment) Precaution Comments: Reviewed supine HEP and knee precautions.  Restrictions Weight Bearing Restrictions: Yes LLE Weight Bearing: Weight bearing as tolerated    Mobility  Bed Mobility Overal bed mobility: Modified Independent Bed Mobility: Supine to Sit;Sit to Supine     Supine to sit: Modified independent (Device/Increase time) Sit to supine: Modified independent (Device/Increase time)      Transfers Overall transfer level: Needs assistance Equipment used: Rolling walker (2 wheeled) Transfers: Sit to/from Stand Sit to Stand: Supervision            Ambulation/Gait Ambulation/Gait assistance: Supervision Ambulation Distance (Feet): 200 Feet Assistive device: Rolling walker (2 wheeled) Gait Pattern/deviations: Decreased step length - right;Decreased step length - left;Decreased weight shift to left;Antalgic;Step-through pattern Gait velocity: decreased   General Gait Details: Patient with intermittent step through gait, good stability, supervision to maximize safety.    Stairs Stairs: Yes Stairs assistance:  Supervision Stair Management: One rail Left;Forwards;Sideways Number of Stairs: 12 General stair comments: cues for foot placement and sequencing on stairs to ensure safety. pt close supervision with foward step to pattern and 1UE support on left rail, and BUE support sideways stepping down. patient could benefit from 1 more visit to ensure safety   Wheelchair Mobility    Modified Rankin (Stroke Patients Only)       Balance Overall balance assessment: Needs assistance Sitting-balance support: No upper extremity supported;Feet supported Sitting balance-Leahy Scale: Good     Standing balance support: Bilateral upper extremity supported;During functional activity Standing balance-Leahy Scale: Fair Standing balance comment: Reliant on BUE support.                             Cognition Arousal/Alertness: Awake/alert Behavior During Therapy: WFL for tasks assessed/performed Overall Cognitive Status: Within Functional Limits for tasks assessed                                        Exercises Total Joint Exercises Long Arc Quad: AROM;10 reps Knee Flexion: AROM;10 reps    General Comments        Pertinent Vitals/Pain Pain Assessment: Faces Faces Pain Scale: Hurts little more Pain Location: L knee (lateral posterior aspect) Pain Descriptors / Indicators: Aching;Operative site guarding Pain Intervention(s): Limited activity within patient's tolerance;Monitored during session;Premedicated before session;Repositioned    Home Living                      Prior Function            PT Goals (current goals can  now be found in the care plan section) Acute Rehab PT Goals Patient Stated Goal: to go home tomorrow  PT Goal Formulation: With patient Time For Goal Achievement: 03/20/18 Potential to Achieve Goals: Good Progress towards PT goals: Progressing toward goals    Frequency    7X/week      PT Plan Current plan remains  appropriate    Co-evaluation              AM-PAC PT "6 Clicks" Daily Activity  Outcome Measure  Difficulty turning over in bed (including adjusting bedclothes, sheets and blankets)?: None Difficulty moving from lying on back to sitting on the side of the bed? : None Difficulty sitting down on and standing up from a chair with arms (e.g., wheelchair, bedside commode, etc,.)?: A Little Help needed moving to and from a bed to chair (including a wheelchair)?: A Little Help needed walking in hospital room?: A Little Help needed climbing 3-5 steps with a railing? : A Little 6 Click Score: 20    End of Session Equipment Utilized During Treatment: Gait belt Activity Tolerance: Patient tolerated treatment well Patient left: with call bell/phone within reach;with family/visitor present;in bed Nurse Communication: Mobility status PT Visit Diagnosis: Other abnormalities of gait and mobility (R26.89);Pain Pain - Right/Left: Left Pain - part of body: Knee     Time: 1610-9604 PT Time Calculation (min) (ACUTE ONLY): 21 min  Charges:  $Gait Training: 8-22 mins                    G Codes:       Etta Grandchild, PT, DPT Acute Rehab Services Pager: [phone]    Etta Grandchild 03/08/2018, 12:12 PM

## 2018-03-09 LAB — URINE CULTURE: CULTURE: NO GROWTH

## 2018-06-12 IMAGING — US US ASPIRATION
1 series · 11 of 11 positions shown · non-contrast
Comparison: none

INDICATION: Painful symptomatic left knee Baker cyst

[Series 1: us aspiration · 0.09mm/px · 11 acquisitions, 11 frames shown]
[im 1/11]
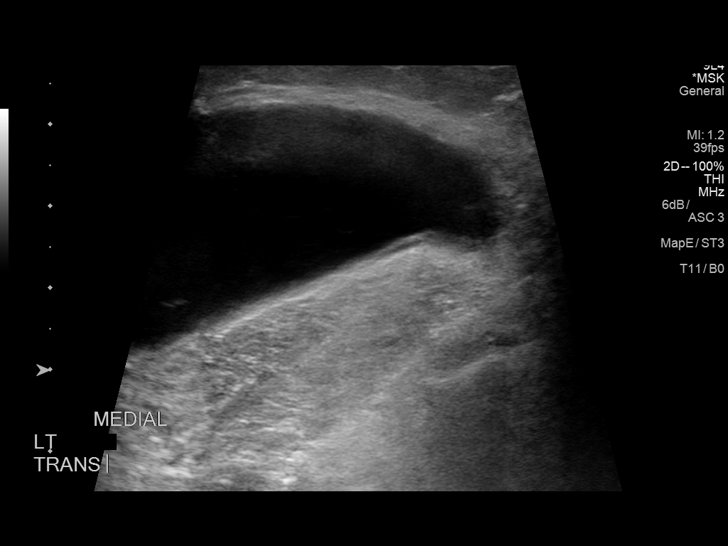
[im 2/11]
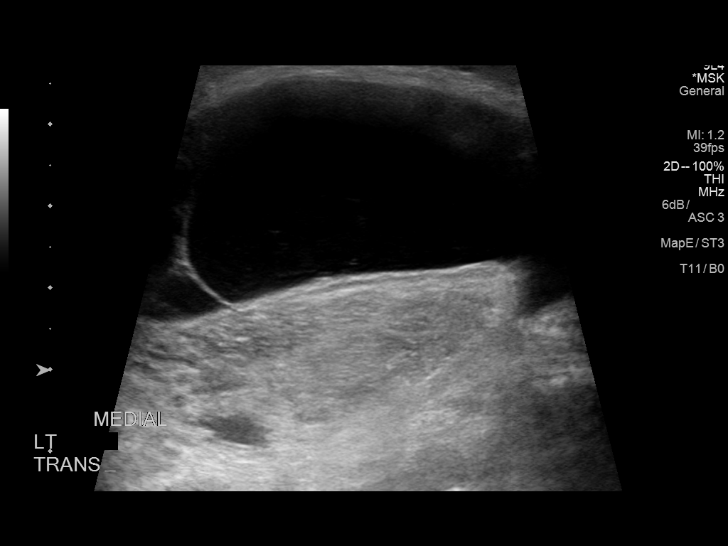
[im 3/11]
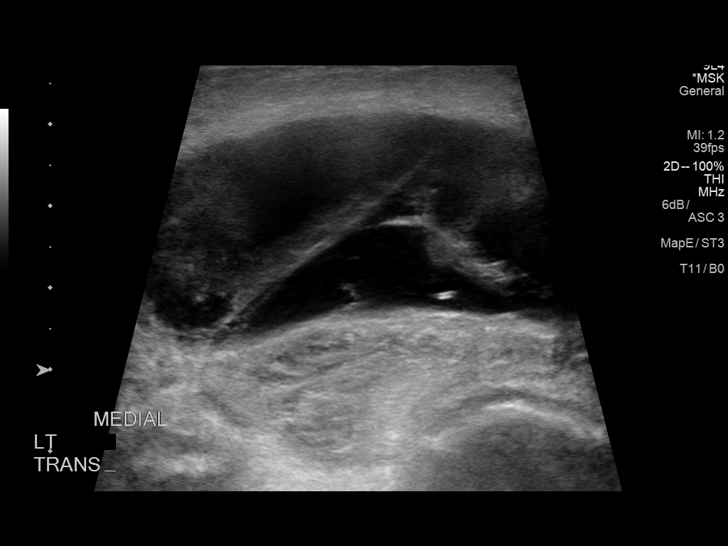
[im 4/11]
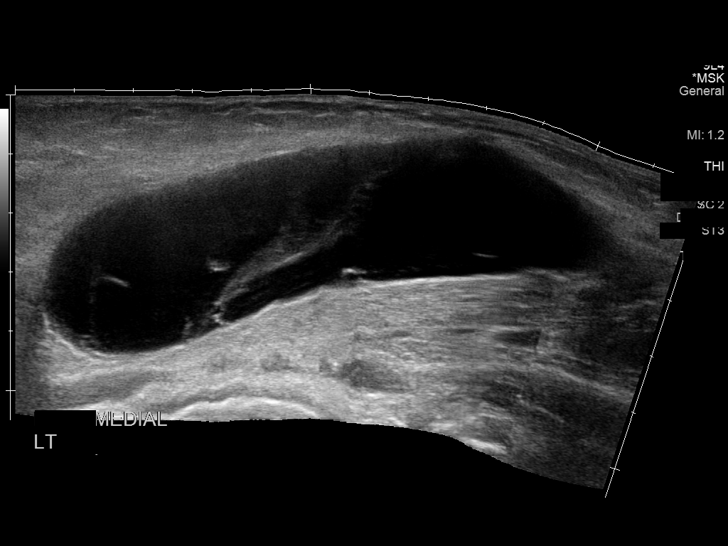
[im 5/11]
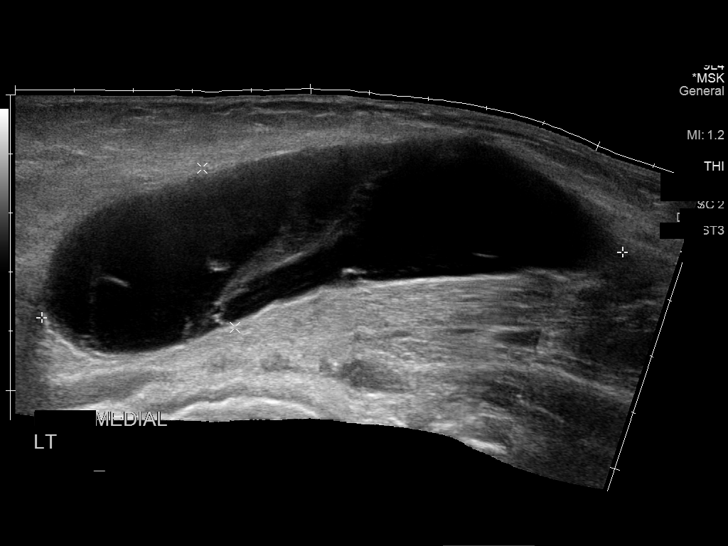
[im 6/11]
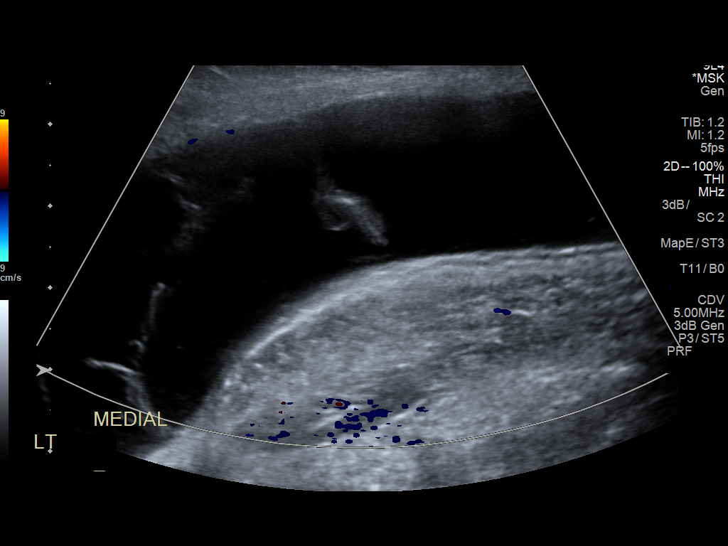
[im 7/11]
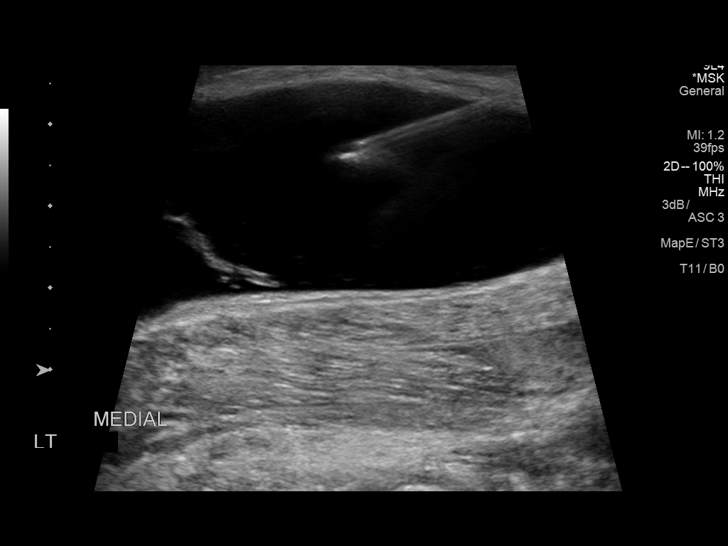
[im 8/11]
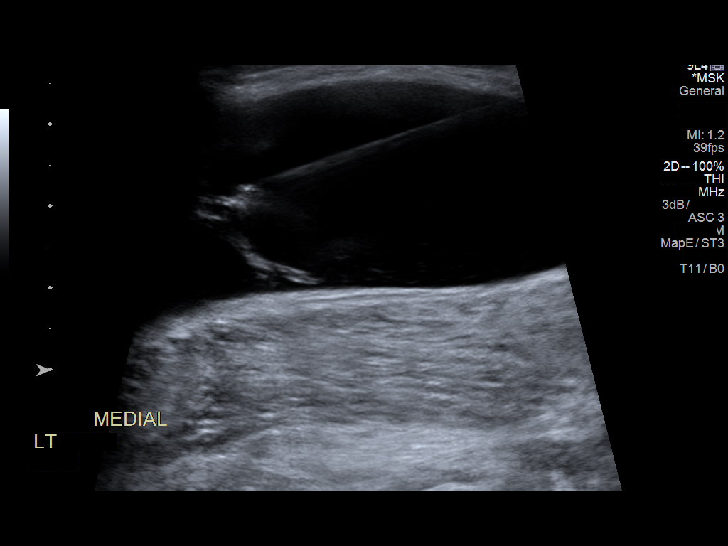
[im 9/11]
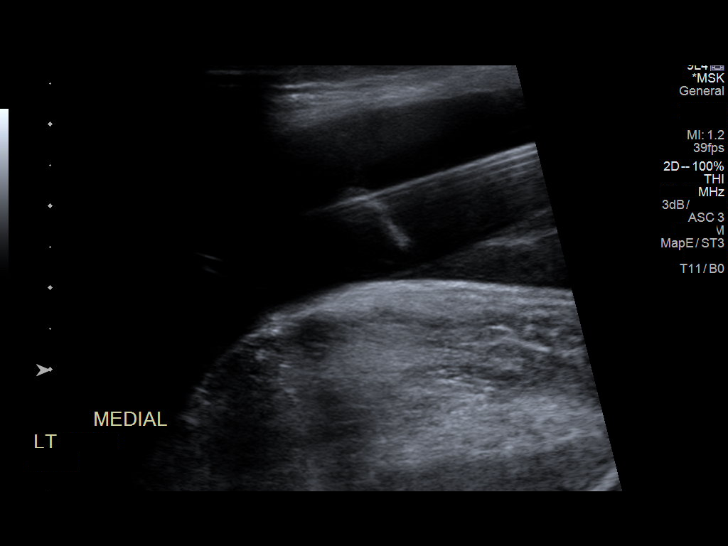
[im 10/11]
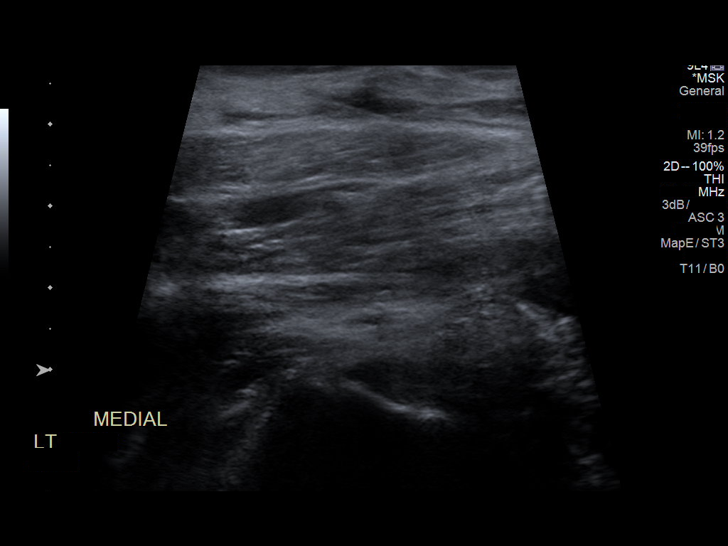
[im 11/11]
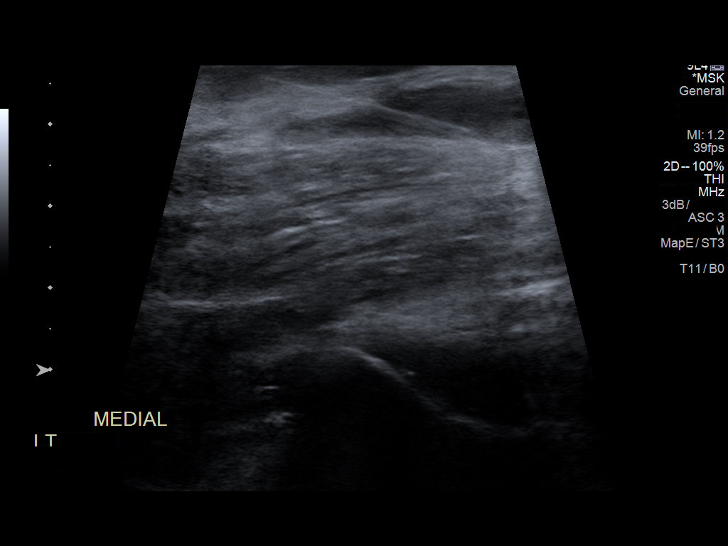

[11 of 11 positions shown; findings below may reference images not displayed]

EXAM:
LEFT KNEE BAKER'S CYST ULTRASOUND ASPIRATION AND THERAPEUTIC
INJECTION

MEDICATIONS:
1% LIDOCAINE LOCALLY.

180 MG DEPO-MEDROL DILUTED IN 4 CC 0.25% BUPIVACAINE

ANESTHESIA/SEDATION:
None.

COMPLICATIONS:
None immediate.

PROCEDURE:
Informed written consent was obtained from the patient after a
thorough discussion of the procedural risks, benefits and
alternatives. All questions were addressed. Maximal Sterile Barrier
Technique was utilized including caps, mask, sterile gowns, sterile
gloves, sterile drape, hand hygiene and skin antiseptic. A timeout
was performed prior to the initiation of the procedure.

Previous imaging reviewed. The large posterior medial left knee
bakers cyst was localized. Overlying skin marked.

Under sterile conditions and local anesthesia, ultrasound guidance
was utilized to advance an 18 gauge spinal needle into the Baker's
cyst. Needle position confirmed with ultrasound. Syringe aspiration
yielded 75 cc synovial fluid. This completely collapsed the cyst.
For therapeutic injection, 180 mg Depo-Medrol diluted in 4 cc
bupivacaine was instilled at the completion the procedure. Needle
removed. Patient tolerated the procedure well. No immediate
complication.
IMPRESSION: Successful ultrasound left knee large Bakers cyst aspiration and
therapeutic injection as above

## 2018-07-15 ENCOUNTER — Encounter: Payer: Self-pay | Admitting: Physician Assistant

## 2018-07-15 DIAGNOSIS — T8484XA Pain due to internal orthopedic prosthetic devices, implants and grafts, initial encounter: Secondary | ICD-10-CM

## 2018-07-15 DIAGNOSIS — M1711 Unilateral primary osteoarthritis, right knee: Secondary | ICD-10-CM | POA: Diagnosis present

## 2018-07-15 DIAGNOSIS — Z96652 Presence of left artificial knee joint: Secondary | ICD-10-CM

## 2018-07-15 HISTORY — DX: Pain due to internal orthopedic prosthetic devices, implants and grafts, initial encounter: T84.84XA

## 2018-07-15 HISTORY — DX: Unilateral primary osteoarthritis, right knee: M17.11

## 2018-07-15 HISTORY — DX: Presence of left artificial knee joint: Z96.652

## 2018-08-22 NOTE — Pre-Procedure Instructions (Signed)
Jackie Cooke  08/22/2018      [id number] - [place], [place] - [address] AT CORNER OF [facility] [address] [place] [address] Phone: [phone] Fax: [phone]    Your procedure is scheduled on 09/04/2018.  Report to [facility] Admitting at 0530 A.M.  Call this number if you have problems the morning of surgery:  [phone]   Remember:  Do not eat or drink after midnight.     Take these medicines the morning of surgery with A SIP OF WATER: Acetaminophen (Tylenol) escitalopram (Lexapro) Estradiol (Estrace) Levothyroxine (Synthroid) Nifedipine (Procardia-XL/Adalat-CC/Nifedical-XL) Oxybutynin (Ditropan-XL) Prednisone (Deltasone)  7 days prior to surgery STOP taking any Voltaren 1% Gel, Aspirin (unless otherwise instructed by your surgeon), Aleve, Naproxen, Ibuprofen, Motrin, Advil, Goody's, BC's, all herbal medications, fish oil, and all vitamins      Do not wear jewelry, make-up or nail polish.  Do not wear lotions, powders, or perfumes, or deodorant.  Do not shave 48 hours prior to surgery.    Do not bring valuables to the hospital.  [facility] is not responsible for any belongings or valuables.  Contacts, eyeglasses, hearing aids, dentures or bridgework may not be worn into surgery.  Leave your suitcase in the car.  After surgery it may be brought to your room.  For patients admitted to the hospital, discharge time will be determined by your treatment team.  Patients discharged the day of surgery will not be allowed to drive home.   Name and phone number of your driver:    Special instructions:   - Preparing For Surgery  Before surgery, you can play an important role. Because skin is not sterile, your skin needs to be as free of germs as possible. You can reduce the number of germs on your skin by washing with CHG (chlorahexidine gluconate) Soap before surgery.  CHG is an antiseptic cleaner  which kills germs and bonds with the skin to continue killing germs even after washing.    Oral Hygiene is also important to reduce your risk of infection.  Remember - BRUSH YOUR TEETH THE MORNING OF SURGERY WITH YOUR REGULAR TOOTHPASTE  Please do not use if you have an allergy to CHG or antibacterial soaps. If your skin becomes reddened/irritated stop using the CHG.  Do not shave (including legs and underarms) for at least 48 hours prior to first CHG shower. It is OK to shave your face.  Please follow these instructions carefully.   1. Shower the NIGHT BEFORE SURGERY and the MORNING OF SURGERY with CHG.   2. If you chose to wash your hair, wash your hair first as usual with your normal shampoo.  3. After you shampoo, rinse your hair and body thoroughly to remove the shampoo.  4. Use CHG as you would any other liquid soap. You can apply CHG directly to the skin and wash gently with a scrungie or a clean washcloth.   5. Apply the CHG Soap to your body ONLY FROM THE NECK DOWN.  Do not use on open wounds or open sores. Avoid contact with your eyes, ears, mouth and genitals (private parts). Wash Face and genitals (private parts)  with your normal soap.  6. Wash thoroughly, paying special attention to the area where your surgery will be performed.  7. Thoroughly rinse your body with warm water from the neck down.  8. DO NOT shower/wash with your normal soap after using and rinsing off the  CHG Soap.  9. Pat yourself dry with a CLEAN TOWEL.  10. Wear CLEAN PAJAMAS to bed the night before surgery, wear comfortable clothes the morning of surgery  11. Place CLEAN SHEETS on your bed the night of your first shower and DO NOT SLEEP WITH PETS.    Day of Surgery: Shower as stated above. Do not apply any deodorants/lotions.  Please wear clean clothes to the hospital/surgery center.   Remember to brush your teeth WITH YOUR REGULAR TOOTHPASTE.    Please read over the following fact sheets that  you were given. Pain Booklet, Coughing and Deep Breathing, MRSA Information and Surgical Site Infection Prevention

## 2018-08-23 ENCOUNTER — Encounter (HOSPITAL_COMMUNITY)
Admission: RE | Admit: 2018-08-23 | Discharge: 2018-08-23 | Disposition: A | Discharge status: Home / Self Care | Payer: Medicare Other | Source: Ambulatory Visit | Attending: Orthopedic Surgery | Admitting: Orthopedic Surgery

## 2018-08-23 ENCOUNTER — Encounter (HOSPITAL_COMMUNITY): Payer: Self-pay

## 2018-08-23 DIAGNOSIS — Z79899 Other long term (current) drug therapy: Secondary | ICD-10-CM | POA: Diagnosis not present

## 2018-08-23 DIAGNOSIS — M1712 Unilateral primary osteoarthritis, left knee: Secondary | ICD-10-CM | POA: Diagnosis not present

## 2018-08-23 DIAGNOSIS — Z79890 Hormone replacement therapy (postmenopausal): Secondary | ICD-10-CM | POA: Insufficient documentation

## 2018-08-23 DIAGNOSIS — M25561 Pain in right knee: Secondary | ICD-10-CM | POA: Insufficient documentation

## 2018-08-23 DIAGNOSIS — M1711 Unilateral primary osteoarthritis, right knee: Secondary | ICD-10-CM | POA: Insufficient documentation

## 2018-08-23 DIAGNOSIS — I10 Essential (primary) hypertension: Secondary | ICD-10-CM | POA: Diagnosis not present

## 2018-08-23 DIAGNOSIS — M7122 Synovial cyst of popliteal space [Baker], left knee: Secondary | ICD-10-CM | POA: Insufficient documentation

## 2018-08-23 DIAGNOSIS — Z87891 Personal history of nicotine dependence: Secondary | ICD-10-CM | POA: Insufficient documentation

## 2018-08-23 DIAGNOSIS — Z01812 Encounter for preprocedural laboratory examination: Secondary | ICD-10-CM | POA: Insufficient documentation

## 2018-08-23 LAB — COMPREHENSIVE METABOLIC PANEL
ALK PHOS: 44 U/L (ref 38–126)
ALT: 16 U/L (ref 0–44)
AST: 18 U/L (ref 15–41)
Albumin: 3.6 g/dL (ref 3.5–5.0)
Anion gap: 7 (ref 5–15)
BILIRUBIN TOTAL: 0.6 mg/dL (ref 0.3–1.2)
BUN: 19 mg/dL (ref 8–23)
CALCIUM: 9.2 mg/dL (ref 8.9–10.3)
CO2: 28 mmol/L (ref 22–32)
CREATININE: 0.76 mg/dL (ref 0.44–1.00)
Chloride: 102 mmol/L (ref 98–111)
Glucose, Bld: 98 mg/dL (ref 70–99)
Potassium: 3.5 mmol/L (ref 3.5–5.1)
Sodium: 137 mmol/L (ref 135–145)
Total Protein: 6.4 g/dL — ABNORMAL LOW (ref 6.5–8.1)

## 2018-08-23 LAB — CBC WITH DIFFERENTIAL/PLATELET
ABS IMMATURE GRANULOCYTES: 0.05 10*3/uL (ref 0.00–0.07)
BASOS PCT: 1 %
Basophils Absolute: 0.1 10*3/uL (ref 0.0–0.1)
Eosinophils Absolute: 0.1 10*3/uL (ref 0.0–0.5)
Eosinophils Relative: 1 %
HEMATOCRIT: 39.5 % (ref 36.0–46.0)
HEMOGLOBIN: 12.5 g/dL (ref 12.0–15.0)
Immature Granulocytes: 1 %
LYMPHS ABS: 1.5 10*3/uL (ref 0.7–4.0)
LYMPHS PCT: 14 %
MCH: 30.6 pg (ref 26.0–34.0)
MCHC: 31.6 g/dL (ref 30.0–36.0)
MCV: 96.8 fL (ref 80.0–100.0)
MONO ABS: 0.6 10*3/uL (ref 0.1–1.0)
MONOS PCT: 6 %
Neutro Abs: 8.1 10*3/uL — ABNORMAL HIGH (ref 1.7–7.7)
Neutrophils Relative %: 77 %
Platelets: 254 10*3/uL (ref 150–400)
RBC: 4.08 MIL/uL (ref 3.87–5.11)
RDW: 13.9 % (ref 11.5–15.5)
WBC: 10.3 10*3/uL (ref 4.0–10.5)
nRBC: 0 % (ref 0.0–0.2)

## 2018-08-23 LAB — APTT: APTT: 26 s (ref 24–36)

## 2018-08-23 LAB — PROTIME-INR
INR: 1.06
PROTHROMBIN TIME: 13.7 s (ref 11.4–15.2)

## 2018-08-23 LAB — SURGICAL PCR SCREEN
MRSA, PCR: NEGATIVE
Staphylococcus aureus: NEGATIVE

## 2018-08-23 NOTE — Progress Notes (Signed)
PCP: [name], NP @ [facility] ([place])

## 2018-08-23 NOTE — H&P (Signed)
TOTAL KNEE ADMISSION H&P  Patient is being admitted for right total knee arthroplasty.  Subjective:  Chief Complaint:right knee pain.  HPI: Jackie Cooke, 67 y.o. female, has a history of pain and functional disability in the right knee due to arthritis and has failed non-surgical conservative treatments for greater than 12 weeks to includeNSAID's and/or analgesics, corticosteriod injections, viscosupplementation injections, flexibility and strengthening excercises, supervised PT with diminished ADL's post treatment, weight reduction as appropriate and activity modification.  Onset of symptoms was gradual, starting 10 years ago with gradually worsening course since that time. The patient noted prior procedures on the knee to include  arthroscopy, menisectomy and ORIF patella on the right knee(s).  Patient currently rates pain in the right knee(s) at 10 out of 10 with activity. Patient has night pain, worsening of pain with activity and weight bearing, pain that interferes with activities of daily living, crepitus and joint swelling.  Patient has evidence of subchondral sclerosis, periarticular osteophytes and joint space narrowing by imaging studies. . There is no active infection.  Patient Active Problem List   Diagnosis Date Noted  . Primary localized osteoarthritis of right knee 07/15/2018  . Painful retained orthopaedic hardware right knee(HCC) 07/15/2018  . S/P total knee replacement, left 07/15/2018  . Leukocytosis 03/07/2018  . Baker's cyst of knee, left 03/07/2018  . Primary localized osteoarthritis of left knee 02/20/2018  . Essential hypertension, benign 02/20/2018  . Hypothyroidism 02/20/2018   Past Medical History:  Diagnosis Date  . Baker's cyst of knee, left 03/07/2018  . Essential hypertension, benign 02/20/2018  . GERD (gastroesophageal reflux disease)    at times takes over the counter meds   . Hypertension   . Hypothyroidism 02/20/2018  . Painful retained orthopaedic  hardware right knee(HCC) 07/15/2018  . PONV (postoperative nausea and vomiting)   . Primary localized osteoarthritis of left knee 02/20/2018  . Primary localized osteoarthritis of right knee 07/15/2018  . S/P total knee replacement, left 07/15/2018  . Thyroid disease     Past Surgical History:  Procedure Laterality Date  . ABDOMINAL HYSTERECTOMY    . BLADDER REPAIR    . KNEE SURGERY Right   . removed ovary  Bilateral   . TOTAL HIP ARTHROPLASTY Right 1988  . TOTAL KNEE ARTHROPLASTY Left 03/06/2018   Procedure: TOTAL KNEE ARTHROPLASTY;  Surgeon: Salvatore Marvel, MD;  Location: [facility] OR;  Service: Orthopedics;  Laterality: Left;  . TOTAL THYROIDECTOMY  2007    No current facility-administered medications for this encounter.    Current Outpatient Medications  Medication Sig Dispense Refill Last Dose  . acetaminophen (TYLENOL) 650 MG CR tablet Take 1,300 mg by mouth 2 (two) times daily.   08/23/2018 at Unknown time  . cholecalciferol (VITAMIN D) 1000 units tablet Take 1,000 Units by mouth daily.   08/23/2018 at Unknown time  . escitalopram (LEXAPRO) 10 MG tablet Take 10 mg by mouth.   08/23/2018 at Unknown time  . estradiol (ESTRACE) 1 MG tablet Take 1 mg by mouth daily.  0 08/23/2018 at Unknown time  . gabapentin (NEURONTIN) 300 MG capsule Take 1 capsule (300 mg total) by mouth at bedtime. (Patient taking differently: Take 300 mg by mouth every other day. ) 30 capsule 0 Past Week at Unknown time  . levothyroxine (SYNTHROID, LEVOTHROID) 137 MCG tablet Take 137 mcg by mouth daily before breakfast.    08/23/2018 at Unknown time  . losartan-hydrochlorothiazide (HYZAAR) 50-12.5 MG tablet Take 1 tablet by mouth daily.   08/23/2018 at Unknown  time  . NIFEdipine (PROCARDIA-XL/ADALAT-CC/NIFEDICAL-XL) 30 MG 24 hr tablet Take 30 mg by mouth daily.    08/23/2018 at Unknown time  . oxybutynin (DITROPAN-XL) 5 MG 24 hr tablet Take 5 mg by mouth daily.  0 08/23/2018 at Unknown time  . predniSONE (DELTASONE) 5 MG  tablet Take 5 mg by mouth daily with breakfast.   08/23/2018 at Unknown time  . VOLTAREN 1 % GEL Apply 1 application topically 2 (two) times daily as needed for pain.  0 08/23/2018 at Unknown time  . zolpidem (AMBIEN) 5 MG tablet Take 5 mg by mouth at bedtime as needed.  1 More than a month at Unknown time   Allergies  Allergen Reactions  . Morphine And Related Anaphylaxis and Shortness Of Breath  . Celebrex [Celecoxib] Hives    Social History   Tobacco Use  . Smoking status: Former Smoker    Packs/day: 0.50    Years: 10.00    Pack years: 5.00    Types: Cigarettes  . Smokeless tobacco: Never Used  . Tobacco comment: quit 27 yrs. ago  Substance Use Topics  . Alcohol use: Yes    Comment: occasional     Family History  Problem Relation Age of Onset  . Hypertension Mother   . Pulmonary fibrosis Father   . Thyroid cancer Sister   . Diabetes Mellitus II Brother      Review of Systems  Constitutional: Negative.   HENT: Negative.   Eyes: Negative.   Respiratory: Negative.   Cardiovascular: Negative.   Gastrointestinal: Negative.   Genitourinary: Negative.   Musculoskeletal: Positive for joint pain.  Skin: Negative.   Neurological: Negative.   Endo/Heme/Allergies: Negative.   Psychiatric/Behavioral: Negative.     Objective:  Physical Exam  Constitutional: She appears well-developed and well-nourished.  HENT:  Head: Normocephalic and atraumatic.  Mouth/Throat: Oropharynx is clear and moist.  Eyes: Pupils are equal, round, and reactive to light. Conjunctivae are normal.  Neck: Neck supple.  Cardiovascular: Normal rate and regular rhythm.  Respiratory: Effort normal and breath sounds normal.  GI: Soft. Bowel sounds are normal.  Genitourinary:  Genitourinary Comments: Not pertinent to current symptomatology therefore not examined.  Musculoskeletal:  Examination of her left knee reveals only mild swelling.  Incision is well healed.  Range of motion 0-120 degrees.  Knee  is stable.  Examination of her right knee reveals diffuse pain.  2+ synovitis.  2+crepitus Range of motion 0-120 degrees.  Knee is stable.      Vital signs in last 24 hours: Temp:  [97.9 F (36.6 C)-98 F (36.7 C)] 97.9 F (36.6 C) (10/30 1400) Pulse Rate:  [74-77] 74 (10/30 1400) Resp:  [20] 20 (10/30 0955) BP: (131-145)/(69-83) 145/83 (10/30 1400) SpO2:  [97 %-98 %] 97 % (10/30 1400) Weight:  [83.6 kg-84.6 kg] 84.6 kg (10/30 1400)  Labs:   Estimated body mass index is 28.37 kg/m as calculated from the following:   Height as of this encounter: 5\' 8"  (1.727 m).   Weight as of this encounter: 84.6 kg.   Imaging Review Plain radiographs demonstrate severe degenerative joint disease of the right knee(s). The overall alignment issignificant valgus. The bone quality appears to be good for age and reported activity level.   Preoperative templating of the joint replacement has been completed, documented, and submitted to the Operating Room personnel in order to optimize intra-operative equipment management.    Patient's anticipated LOS is less than 2 midnights, meeting these requirements: - Younger than 65 -  Lives within 1 hour of care - Has a competent adult at home to recover with post-op recover - NO history of  - Chronic pain requiring opiods  - Diabetes  - Coronary Artery Disease  - Heart failure  - Heart attack  - Stroke  - DVT/VTE  - Cardiac arrhythmia  - Respiratory Failure/COPD  - Renal failure  - Anemia  - Advanced Liver disease        Assessment/Plan:  End stage arthritis, right knee   The patient history, physical examination, clinical judgment of the provider and imaging studies are consistent with end stage degenerative joint disease of the right knee(s) and total knee arthroplasty is deemed medically necessary. The treatment options including medical management, injection therapy arthroscopy and arthroplasty were discussed at length. The risks and  benefits of total knee arthroplasty were presented and reviewed. The risks due to aseptic loosening, infection, stiffness, patella tracking problems, thromboembolic complications and other imponderables were discussed. The patient acknowledged the explanation, agreed to proceed with the plan and consent was signed. Patient is being admitted for inpatient treatment for surgery, pain control, PT, OT, prophylactic antibiotics, VTE prophylaxis, progressive ambulation and ADL's and discharge planning. The patient is planning to be discharged home with home health services

## 2018-08-24 LAB — URINE CULTURE: Culture: NO GROWTH

## 2018-08-25 ENCOUNTER — Other Ambulatory Visit (HOSPITAL_COMMUNITY): Payer: Medicare Other

## 2018-09-01 ENCOUNTER — Other Ambulatory Visit: Payer: Self-pay | Admitting: Orthopedic Surgery

## 2018-09-01 MED ORDER — BUPIVACAINE LIPOSOME 1.3 % IJ SUSP
20.0000 mL | INTRAMUSCULAR | Status: DC
  Filled 2018-09-01: qty 20

## 2018-09-01 MED ORDER — TRANEXAMIC ACID-NACL 1000-0.7 MG/100ML-% IV SOLN
1000.0000 mg | INTRAVENOUS | Status: AC
  Administered 2018-09-04: 1000 mg via INTRAVENOUS
  Filled 2018-09-01: qty 100

## 2018-09-01 NOTE — Care Plan (Signed)
Spoke with patient. Will discharge to home with family and HHPT. Has equipment at home. CPM ordered.   Shauna Hugh, RNCM  [phone]

## 2018-09-03 NOTE — Anesthesia Preprocedure Evaluation (Addendum)
Anesthesia Evaluation  Patient identified by MRN, date of birth, ID band Patient awake    Reviewed: Allergy & Precautions, NPO status , Patient's Chart, lab work & pertinent test results  History of Anesthesia Complications (+) PONV  Airway Mallampati: I  TM Distance: >3 FB Neck ROM: Full    Dental no notable dental hx. (+) Teeth Intact, Dental Advisory Given   Pulmonary neg pulmonary ROS, former smoker,    Pulmonary exam normal breath sounds clear to auscultation       Cardiovascular hypertension, Normal cardiovascular exam Rhythm:Regular Rate:Normal     Neuro/Psych negative neurological ROS  negative psych ROS   GI/Hepatic Neg liver ROS, GERD  ,  Endo/Other  Hypothyroidism   Renal/GU negative Renal ROS  negative genitourinary   Musculoskeletal  (+) Arthritis , Osteoarthritis,    Abdominal   Peds  Hematology negative hematology ROS (+)   Anesthesia Other Findings   Reproductive/Obstetrics                            Anesthesia Physical Anesthesia Plan  ASA: II  Anesthesia Plan: Spinal and Regional   Post-op Pain Management:  Regional for Post-op pain   Induction:   PONV Risk Score and Plan: Treatment may vary due to age or medical condition  Airway Management Planned: Natural Airway  Additional Equipment:   Intra-op Plan:   Post-operative Plan:   Informed Consent: I have reviewed the patients History and Physical, chart, labs and discussed the procedure including the risks, benefits and alternatives for the proposed anesthesia with the patient or authorized representative who has indicated his/her understanding and acceptance.   Dental advisory given  Plan Discussed with: CRNA  Anesthesia Plan Comments:        Anesthesia Quick Evaluation

## 2018-09-04 ENCOUNTER — Encounter (HOSPITAL_COMMUNITY): Payer: Self-pay | Admitting: Certified Registered"

## 2018-09-04 ENCOUNTER — Inpatient Hospital Stay (HOSPITAL_COMMUNITY): Payer: Medicare Other | Admitting: Anesthesiology

## 2018-09-04 ENCOUNTER — Encounter (HOSPITAL_COMMUNITY)
Admission: RE | Disposition: A | Discharge status: Home Health | Payer: Self-pay | Source: Home / Self Care | Attending: Orthopedic Surgery

## 2018-09-04 ENCOUNTER — Other Ambulatory Visit: Payer: Self-pay

## 2018-09-04 ENCOUNTER — Inpatient Hospital Stay (HOSPITAL_COMMUNITY)
Admission: RE | Admit: 2018-09-04 | Discharge: 2018-09-06 | DRG: 470 | Disposition: A | Discharge status: Home Health | Payer: Medicare Other | Attending: Orthopedic Surgery | Admitting: Orthopedic Surgery

## 2018-09-04 DIAGNOSIS — Z886 Allergy status to analgesic agent status: Secondary | ICD-10-CM

## 2018-09-04 DIAGNOSIS — Z96641 Presence of right artificial hip joint: Secondary | ICD-10-CM | POA: Diagnosis present

## 2018-09-04 DIAGNOSIS — Z79890 Hormone replacement therapy (postmenopausal): Secondary | ICD-10-CM

## 2018-09-04 DIAGNOSIS — M2100 Valgus deformity, not elsewhere classified, unspecified site: Secondary | ICD-10-CM | POA: Diagnosis present

## 2018-09-04 DIAGNOSIS — K219 Gastro-esophageal reflux disease without esophagitis: Secondary | ICD-10-CM | POA: Diagnosis present

## 2018-09-04 DIAGNOSIS — Z808 Family history of malignant neoplasm of other organs or systems: Secondary | ICD-10-CM

## 2018-09-04 DIAGNOSIS — M25761 Osteophyte, right knee: Secondary | ICD-10-CM | POA: Diagnosis present

## 2018-09-04 DIAGNOSIS — Z23 Encounter for immunization: Secondary | ICD-10-CM

## 2018-09-04 DIAGNOSIS — I10 Essential (primary) hypertension: Secondary | ICD-10-CM | POA: Diagnosis present

## 2018-09-04 DIAGNOSIS — T8484XA Pain due to internal orthopedic prosthetic devices, implants and grafts, initial encounter: Secondary | ICD-10-CM | POA: Diagnosis present

## 2018-09-04 DIAGNOSIS — M1711 Unilateral primary osteoarthritis, right knee: Principal | ICD-10-CM | POA: Diagnosis present

## 2018-09-04 DIAGNOSIS — Z8249 Family history of ischemic heart disease and other diseases of the circulatory system: Secondary | ICD-10-CM

## 2018-09-04 DIAGNOSIS — Z90710 Acquired absence of both cervix and uterus: Secondary | ICD-10-CM

## 2018-09-04 DIAGNOSIS — Z96652 Presence of left artificial knee joint: Secondary | ICD-10-CM

## 2018-09-04 DIAGNOSIS — Z87891 Personal history of nicotine dependence: Secondary | ICD-10-CM

## 2018-09-04 DIAGNOSIS — Z7952 Long term (current) use of systemic steroids: Secondary | ICD-10-CM

## 2018-09-04 DIAGNOSIS — Z885 Allergy status to narcotic agent status: Secondary | ICD-10-CM

## 2018-09-04 DIAGNOSIS — Z833 Family history of diabetes mellitus: Secondary | ICD-10-CM

## 2018-09-04 DIAGNOSIS — E039 Hypothyroidism, unspecified: Secondary | ICD-10-CM | POA: Diagnosis present

## 2018-09-04 HISTORY — DX: Unilateral primary osteoarthritis, right knee: M17.11

## 2018-09-04 HISTORY — DX: Presence of left artificial knee joint: Z96.652

## 2018-09-04 HISTORY — PX: TOTAL KNEE ARTHROPLASTY: SHX125

## 2018-09-04 HISTORY — DX: Pain due to internal orthopedic prosthetic devices, implants and grafts, initial encounter: T84.84XA

## 2018-09-04 HISTORY — PX: HARDWARE REMOVAL: SHX979

## 2018-09-04 SURGERY — ARTHROPLASTY, KNEE, TOTAL
Anesthesia: Regional | Site: Knee | Laterality: Right

## 2018-09-04 MED ORDER — CEFAZOLIN SODIUM-DEXTROSE 2-4 GM/100ML-% IV SOLN
2.0000 g | INTRAVENOUS | Status: AC
  Administered 2018-09-04: 2 g via INTRAVENOUS

## 2018-09-04 MED ORDER — BUPIVACAINE IN DEXTROSE 0.75-8.25 % IT SOLN
INTRATHECAL | Status: DC | PRN
  Administered 2018-09-04: 1.6 mL via INTRATHECAL

## 2018-09-04 MED ORDER — PHENOL 1.4 % MT LIQD: 1.0000 | OROMUCOSAL | Status: DC | PRN

## 2018-09-04 MED ORDER — ONDANSETRON HCL 4 MG/2ML IJ SOLN
INTRAMUSCULAR | Status: AC
  Filled 2018-09-04: qty 6

## 2018-09-04 MED ORDER — SODIUM CHLORIDE 0.9 % IV SOLN
INTRAVENOUS | Status: DC | PRN
  Administered 2018-09-04: 25 ug/min via INTRAVENOUS

## 2018-09-04 MED ORDER — FENTANYL CITRATE (PF) 100 MCG/2ML IJ SOLN
25.0000 ug | INTRAMUSCULAR | Status: DC | PRN
  Administered 2018-09-04 (×2): 50 ug via INTRAVENOUS

## 2018-09-04 MED ORDER — BUPIVACAINE-EPINEPHRINE 0.5% -1:200000 IJ SOLN
INTRAMUSCULAR | Status: DC | PRN
  Administered 2018-09-04: 50 mL

## 2018-09-04 MED ORDER — PNEUMOCOCCAL VAC POLYVALENT 25 MCG/0.5ML IJ INJ
0.5000 mL | INJECTION | INTRAMUSCULAR | Status: AC | PRN
  Administered 2018-09-06: 0.5 mL via INTRAMUSCULAR
  Filled 2018-09-04: qty 0.5

## 2018-09-04 MED ORDER — ZOLPIDEM TARTRATE 5 MG PO TABS
5.0000 mg | ORAL_TABLET | Freq: Every evening | ORAL | Status: DC | PRN
  Administered 2018-09-05 (×2): 5 mg via ORAL
  Filled 2018-09-04 (×2): qty 1

## 2018-09-04 MED ORDER — MIDAZOLAM HCL 2 MG/2ML IJ SOLN
INTRAMUSCULAR | Status: DC | PRN
  Administered 2018-09-04: 2 mg via INTRAVENOUS

## 2018-09-04 MED ORDER — PROPOFOL 1000 MG/100ML IV EMUL
INTRAVENOUS | Status: AC
  Filled 2018-09-04: qty 100

## 2018-09-04 MED ORDER — OXYCODONE HCL 5 MG PO TABS
5.0000 mg | ORAL_TABLET | ORAL | Status: DC | PRN
  Administered 2018-09-04 (×3): 10 mg via ORAL
  Administered 2018-09-05: 5 mg via ORAL
  Administered 2018-09-05 – 2018-09-06 (×5): 10 mg via ORAL
  Administered 2018-09-06: 5 mg via ORAL
  Filled 2018-09-04 (×3): qty 2
  Filled 2018-09-04: qty 1
  Filled 2018-09-04 (×6): qty 2

## 2018-09-04 MED ORDER — DEXAMETHASONE SODIUM PHOSPHATE 10 MG/ML IJ SOLN
10.0000 mg | Freq: Three times a day (TID) | INTRAMUSCULAR | Status: AC
  Administered 2018-09-04 – 2018-09-05 (×4): 10 mg via INTRAVENOUS
  Filled 2018-09-04 (×4): qty 1

## 2018-09-04 MED ORDER — CHLORHEXIDINE GLUCONATE 4 % EX LIQD: 60.0000 mL | Freq: Once | CUTANEOUS | Status: DC

## 2018-09-04 MED ORDER — ACETAMINOPHEN 500 MG PO TABS
1000.0000 mg | ORAL_TABLET | Freq: Four times a day (QID) | ORAL | Status: AC
  Administered 2018-09-04 – 2018-09-05 (×4): 1000 mg via ORAL
  Filled 2018-09-04 (×4): qty 2

## 2018-09-04 MED ORDER — ONDANSETRON HCL 4 MG/2ML IJ SOLN
INTRAMUSCULAR | Status: DC | PRN
  Administered 2018-09-04: 4 mg via INTRAVENOUS

## 2018-09-04 MED ORDER — CEFUROXIME SODIUM 1.5 G IV SOLR
INTRAVENOUS | Status: AC
  Filled 2018-09-04: qty 1.5

## 2018-09-04 MED ORDER — DEXAMETHASONE SODIUM PHOSPHATE 10 MG/ML IJ SOLN
INTRAMUSCULAR | Status: AC
  Filled 2018-09-04: qty 3

## 2018-09-04 MED ORDER — MENTHOL 3 MG MT LOZG: 1.0000 | LOZENGE | OROMUCOSAL | Status: DC | PRN

## 2018-09-04 MED ORDER — DIPHENHYDRAMINE HCL 12.5 MG/5ML PO ELIX: 12.5000 mg | ORAL_SOLUTION | ORAL | Status: DC | PRN

## 2018-09-04 MED ORDER — BUPIVACAINE LIPOSOME 1.3 % IJ SUSP
INTRAMUSCULAR | Status: DC | PRN
  Administered 2018-09-04: 20 mL

## 2018-09-04 MED ORDER — PREDNISONE 5 MG PO TABS
5.0000 mg | ORAL_TABLET | Freq: Every day | ORAL | Status: DC
  Administered 2018-09-05 – 2018-09-06 (×2): 5 mg via ORAL
  Filled 2018-09-04 (×2): qty 1

## 2018-09-04 MED ORDER — CEFAZOLIN SODIUM-DEXTROSE 2-4 GM/100ML-% IV SOLN
2.0000 g | Freq: Four times a day (QID) | INTRAVENOUS | Status: AC
  Administered 2018-09-04 (×2): 2 g via INTRAVENOUS
  Filled 2018-09-04 (×2): qty 100

## 2018-09-04 MED ORDER — LEVOTHYROXINE SODIUM 25 MCG PO TABS
137.0000 ug | ORAL_TABLET | Freq: Every day | ORAL | Status: DC
  Administered 2018-09-05 – 2018-09-06 (×2): 137 ug via ORAL
  Filled 2018-09-04 (×2): qty 1

## 2018-09-04 MED ORDER — ASPIRIN EC 325 MG PO TBEC
325.0000 mg | DELAYED_RELEASE_TABLET | Freq: Every day | ORAL | Status: DC
  Administered 2018-09-05 – 2018-09-06 (×2): 325 mg via ORAL
  Filled 2018-09-04 (×2): qty 1

## 2018-09-04 MED ORDER — DEXAMETHASONE SODIUM PHOSPHATE 10 MG/ML IJ SOLN
INTRAMUSCULAR | Status: DC | PRN
  Administered 2018-09-04: 10 mg via INTRAVENOUS

## 2018-09-04 MED ORDER — OXYBUTYNIN CHLORIDE ER 5 MG PO TB24
5.0000 mg | ORAL_TABLET | Freq: Every day | ORAL | Status: DC
  Administered 2018-09-04 – 2018-09-06 (×3): 5 mg via ORAL
  Filled 2018-09-04 (×3): qty 1

## 2018-09-04 MED ORDER — MIDAZOLAM HCL 2 MG/2ML IJ SOLN
INTRAMUSCULAR | Status: AC
  Filled 2018-09-04: qty 2

## 2018-09-04 MED ORDER — CEFUROXIME SODIUM 1.5 G IV SOLR
INTRAVENOUS | Status: DC | PRN
  Administered 2018-09-04: 1.5 g via INTRAVENOUS

## 2018-09-04 MED ORDER — POLYETHYLENE GLYCOL 3350 17 G PO PACK
17.0000 g | PACK | Freq: Two times a day (BID) | ORAL | Status: DC
  Administered 2018-09-04 – 2018-09-06 (×4): 17 g via ORAL
  Filled 2018-09-04 (×4): qty 1

## 2018-09-04 MED ORDER — METOCLOPRAMIDE HCL 5 MG/ML IJ SOLN: 5.0000 mg | Freq: Three times a day (TID) | INTRAMUSCULAR | Status: DC | PRN

## 2018-09-04 MED ORDER — PHENYLEPHRINE 40 MCG/ML (10ML) SYRINGE FOR IV PUSH (FOR BLOOD PRESSURE SUPPORT)
PREFILLED_SYRINGE | INTRAVENOUS | Status: AC
  Filled 2018-09-04: qty 30

## 2018-09-04 MED ORDER — ESMOLOL HCL 100 MG/10ML IV SOLN
INTRAVENOUS | Status: AC
  Filled 2018-09-04: qty 10

## 2018-09-04 MED ORDER — VITAMIN D 25 MCG (1000 UNIT) PO TABS
1000.0000 [IU] | ORAL_TABLET | Freq: Every day | ORAL | Status: DC
  Administered 2018-09-04 – 2018-09-06 (×3): 1000 [IU] via ORAL

## 2018-09-04 MED ORDER — ESCITALOPRAM OXALATE 10 MG PO TABS
10.0000 mg | ORAL_TABLET | Freq: Every day | ORAL | Status: DC
  Administered 2018-09-05 – 2018-09-06 (×2): 10 mg via ORAL
  Filled 2018-09-04 (×2): qty 1

## 2018-09-04 MED ORDER — SODIUM CHLORIDE (PF) 0.9 % IJ SOLN
INTRAMUSCULAR | Status: DC | PRN
  Administered 2018-09-04: 30 mL
  Administered 2018-09-04: 20 mL

## 2018-09-04 MED ORDER — BUPIVACAINE HCL (PF) 0.25 % IJ SOLN
INTRAMUSCULAR | Status: AC
  Filled 2018-09-04: qty 30

## 2018-09-04 MED ORDER — CEFAZOLIN SODIUM-DEXTROSE 2-4 GM/100ML-% IV SOLN
INTRAVENOUS | Status: AC
  Filled 2018-09-04: qty 100

## 2018-09-04 MED ORDER — ONDANSETRON HCL 4 MG/2ML IJ SOLN
4.0000 mg | Freq: Four times a day (QID) | INTRAMUSCULAR | Status: DC | PRN
  Administered 2018-09-05: 4 mg via INTRAVENOUS
  Filled 2018-09-04: qty 2

## 2018-09-04 MED ORDER — FENTANYL CITRATE (PF) 250 MCG/5ML IJ SOLN
INTRAMUSCULAR | Status: AC
  Filled 2018-09-04: qty 5

## 2018-09-04 MED ORDER — PROPOFOL 10 MG/ML IV BOLUS
INTRAVENOUS | Status: DC | PRN
  Administered 2018-09-04 (×2): 25 mg via INTRAVENOUS
  Administered 2018-09-04: 30 mg via INTRAVENOUS

## 2018-09-04 MED ORDER — 0.9 % SODIUM CHLORIDE (POUR BTL) OPTIME
TOPICAL | Status: DC | PRN
  Administered 2018-09-04: 1000 mL

## 2018-09-04 MED ORDER — NIFEDIPINE ER OSMOTIC RELEASE 30 MG PO TB24
30.0000 mg | ORAL_TABLET | Freq: Every day | ORAL | Status: DC
  Administered 2018-09-05 – 2018-09-06 (×2): 30 mg via ORAL
  Filled 2018-09-04 (×2): qty 1

## 2018-09-04 MED ORDER — LACTATED RINGERS IV SOLN
INTRAVENOUS | Status: DC | PRN
  Administered 2018-09-04: 07:00:00 via INTRAVENOUS

## 2018-09-04 MED ORDER — METOCLOPRAMIDE HCL 5 MG PO TABS: 5.0000 mg | ORAL_TABLET | Freq: Three times a day (TID) | ORAL | Status: DC | PRN

## 2018-09-04 MED ORDER — DOCUSATE SODIUM 100 MG PO CAPS
100.0000 mg | ORAL_CAPSULE | Freq: Two times a day (BID) | ORAL | Status: DC
  Administered 2018-09-04 – 2018-09-06 (×5): 100 mg via ORAL
  Filled 2018-09-04 (×5): qty 1

## 2018-09-04 MED ORDER — ROPIVACAINE HCL 7.5 MG/ML IJ SOLN
INTRAMUSCULAR | Status: DC | PRN
  Administered 2018-09-04: 20 mL via PERINEURAL

## 2018-09-04 MED ORDER — SODIUM CHLORIDE 0.9 % IR SOLN
Status: DC | PRN
  Administered 2018-09-04: 3000 mL

## 2018-09-04 MED ORDER — ONDANSETRON HCL 4 MG PO TABS: 4.0000 mg | ORAL_TABLET | Freq: Four times a day (QID) | ORAL | Status: DC | PRN

## 2018-09-04 MED ORDER — PROPOFOL 500 MG/50ML IV EMUL
INTRAVENOUS | Status: DC | PRN
  Administered 2018-09-04: 150 ug/kg/min via INTRAVENOUS

## 2018-09-04 MED ORDER — FENTANYL CITRATE (PF) 100 MCG/2ML IJ SOLN
INTRAMUSCULAR | Status: AC
  Filled 2018-09-04: qty 2

## 2018-09-04 MED ORDER — ALUM & MAG HYDROXIDE-SIMETH 200-200-20 MG/5ML PO SUSP: 30.0000 mL | ORAL | Status: DC | PRN

## 2018-09-04 MED ORDER — FENTANYL CITRATE (PF) 250 MCG/5ML IJ SOLN
INTRAMUSCULAR | Status: DC | PRN
  Administered 2018-09-04 (×2): 50 ug via INTRAVENOUS

## 2018-09-04 MED ORDER — HYDROMORPHONE HCL 1 MG/ML IJ SOLN
0.5000 mg | INTRAMUSCULAR | Status: DC | PRN
  Administered 2018-09-04 – 2018-09-05 (×7): 1 mg via INTRAVENOUS
  Filled 2018-09-04 (×7): qty 1

## 2018-09-04 MED ORDER — ESCITALOPRAM OXALATE 10 MG PO TABS: 10.0000 mg | ORAL_TABLET | Freq: Every day | ORAL | Status: DC

## 2018-09-04 MED ORDER — ONDANSETRON HCL 4 MG/2ML IJ SOLN
INTRAMUSCULAR | Status: AC
  Filled 2018-09-04: qty 2

## 2018-09-04 MED ORDER — BUPIVACAINE-EPINEPHRINE 0.5% -1:200000 IJ SOLN
INTRAMUSCULAR | Status: AC
  Filled 2018-09-04: qty 1

## 2018-09-04 MED ORDER — POVIDONE-IODINE 7.5 % EX SOLN: Freq: Once | CUTANEOUS | Status: DC

## 2018-09-04 MED ORDER — ARTIFICIAL TEARS OPHTHALMIC OINT
TOPICAL_OINTMENT | OPHTHALMIC | Status: AC
  Filled 2018-09-04: qty 3.5

## 2018-09-04 MED ORDER — POTASSIUM CHLORIDE IN NACL 20-0.9 MEQ/L-% IV SOLN
INTRAVENOUS | Status: DC
  Administered 2018-09-04 (×2): via INTRAVENOUS
  Filled 2018-09-04 (×3): qty 1000

## 2018-09-04 MED ORDER — LACTATED RINGERS IV SOLN: INTRAVENOUS | Status: DC

## 2018-09-04 MED ORDER — GABAPENTIN 300 MG PO CAPS
300.0000 mg | ORAL_CAPSULE | Freq: Every day | ORAL | Status: DC
  Administered 2018-09-04 – 2018-09-05 (×2): 300 mg via ORAL
  Filled 2018-09-04 (×2): qty 1

## 2018-09-04 SURGICAL SUPPLY — 105 items
ATTUNE PS FEM RT SZ 5 CEM KNEE (Femur) ×3 IMPLANT
ATTUNE PSRP INSR SZ5 6 KNEE (Insert) ×2 IMPLANT
ATTUNE PSRP INSR SZ5 6MM KNEE (Insert) ×1 IMPLANT
BANDAGE ACE 4X5 VEL STRL LF (GAUZE/BANDAGES/DRESSINGS) ×3 IMPLANT
BANDAGE ACE 6X5 VEL STRL LF (GAUZE/BANDAGES/DRESSINGS) ×3 IMPLANT
BANDAGE ESMARK 6X9 LF (GAUZE/BANDAGES/DRESSINGS) ×1 IMPLANT
BASE TIBIA ATTUNE KNEE SYS SZ6 (Knees) ×1 IMPLANT
BENZOIN TINCTURE PRP APPL 2/3 (GAUZE/BANDAGES/DRESSINGS) ×3 IMPLANT
BLADE SAGITTAL 25.0X1.19X90 (BLADE) ×2 IMPLANT
BLADE SAGITTAL 25.0X1.19X90MM (BLADE) ×1
BLADE SAW SGTL 13X75X1.27 (BLADE) ×3 IMPLANT
BLADE SURG 10 STRL SS (BLADE) ×6 IMPLANT
BNDG ELASTIC 6X10 VLCR STRL LF (GAUZE/BANDAGES/DRESSINGS) ×3 IMPLANT
BNDG ELASTIC 6X15 VLCR STRL LF (GAUZE/BANDAGES/DRESSINGS) ×3 IMPLANT
BNDG ESMARK 6X9 LF (GAUZE/BANDAGES/DRESSINGS) ×3
BNDG GAUZE ELAST 4 BULKY (GAUZE/BANDAGES/DRESSINGS) ×3 IMPLANT
BOWL SMART MIX CTS (DISPOSABLE) ×3 IMPLANT
CEMENT HV SMART SET (Cement) ×6 IMPLANT
CLOSURE STERI-STRIP 1/2X4 (GAUZE/BANDAGES/DRESSINGS) ×1
CLOSURE WOUND 1/2 X4 (GAUZE/BANDAGES/DRESSINGS) ×1
CLSR STERI-STRIP ANTIMIC 1/2X4 (GAUZE/BANDAGES/DRESSINGS) ×2 IMPLANT
COVER MAYO STAND STRL (DRAPES) ×6 IMPLANT
COVER SURGICAL LIGHT HANDLE (MISCELLANEOUS) ×3 IMPLANT
COVER WAND RF STERILE (DRAPES) ×3 IMPLANT
CUFF TOURNIQUET SINGLE 18IN (TOURNIQUET CUFF) IMPLANT
CUFF TOURNIQUET SINGLE 24IN (TOURNIQUET CUFF) IMPLANT
CUFF TOURNIQUET SINGLE 34IN LL (TOURNIQUET CUFF) ×3 IMPLANT
CUFF TOURNIQUET SINGLE 44IN (TOURNIQUET CUFF) IMPLANT
DECANTER SPIKE VIAL GLASS SM (MISCELLANEOUS) ×3 IMPLANT
DRAPE C-ARM 42X72 X-RAY (DRAPES) IMPLANT
DRAPE EXTREMITY T 121X128X90 (DRAPE) ×3 IMPLANT
DRAPE HALF SHEET 40X57 (DRAPES) ×6 IMPLANT
DRAPE INCISE IOBAN 66X45 STRL (DRAPES) IMPLANT
DRAPE OEC MINIVIEW 54X84 (DRAPES) IMPLANT
DRAPE ORTHO SPLIT 77X108 STRL (DRAPES) ×2
DRAPE SURG ORHT 6 SPLT 77X108 (DRAPES) ×1 IMPLANT
DRAPE U-SHAPE 47X51 STRL (DRAPES) ×3 IMPLANT
DRESSING AQUACEL AG SP 3.5X10 (GAUZE/BANDAGES/DRESSINGS) ×1 IMPLANT
DRSG AQUACEL AG ADV 3.5X10 (GAUZE/BANDAGES/DRESSINGS) ×3 IMPLANT
DRSG AQUACEL AG SP 3.5X10 (GAUZE/BANDAGES/DRESSINGS) ×3
DURAPREP 26ML APPLICATOR (WOUND CARE) ×3 IMPLANT
ELECT CAUTERY BLADE 6.4 (BLADE) ×3 IMPLANT
ELECT REM PT RETURN 9FT ADLT (ELECTROSURGICAL) ×3
ELECTRODE REM PT RTRN 9FT ADLT (ELECTROSURGICAL) ×1 IMPLANT
FACESHIELD WRAPAROUND (MASK) ×3 IMPLANT
GAUZE SPONGE 4X4 12PLY STRL (GAUZE/BANDAGES/DRESSINGS) ×3 IMPLANT
GAUZE XEROFORM 1X8 LF (GAUZE/BANDAGES/DRESSINGS) ×6 IMPLANT
GAUZE XEROFORM 5X9 LF (GAUZE/BANDAGES/DRESSINGS) ×3 IMPLANT
GLOVE BIO SURGEON STRL SZ7 (GLOVE) ×3 IMPLANT
GLOVE BIOGEL PI IND STRL 7.0 (GLOVE) ×1 IMPLANT
GLOVE BIOGEL PI IND STRL 7.5 (GLOVE) ×1 IMPLANT
GLOVE BIOGEL PI INDICATOR 7.0 (GLOVE) ×2
GLOVE BIOGEL PI INDICATOR 7.5 (GLOVE) ×2
GLOVE SS BIOGEL STRL SZ 7.5 (GLOVE) ×1 IMPLANT
GLOVE SUPERSENSE BIOGEL SZ 7.5 (GLOVE) ×2
GOWN STRL REUS W/ TWL LRG LVL3 (GOWN DISPOSABLE) ×3 IMPLANT
GOWN STRL REUS W/ TWL XL LVL3 (GOWN DISPOSABLE) ×1 IMPLANT
GOWN STRL REUS W/TWL LRG LVL3 (GOWN DISPOSABLE) ×6
GOWN STRL REUS W/TWL XL LVL3 (GOWN DISPOSABLE) ×2
HANDPIECE INTERPULSE COAX TIP (DISPOSABLE) ×2
HOOD PEEL AWAY FACE SHEILD DIS (HOOD) ×6 IMPLANT
IMMOBILIZER KNEE 22 UNIV (SOFTGOODS) ×3 IMPLANT
KIT BASIN OR (CUSTOM PROCEDURE TRAY) ×3 IMPLANT
KIT TURNOVER KIT B (KITS) ×3 IMPLANT
MANIFOLD NEPTUNE II (INSTRUMENTS) ×3 IMPLANT
MARKER SKIN DUAL TIP RULER LAB (MISCELLANEOUS) ×3 IMPLANT
NEEDLE HYPO 21X1.5 SAFETY (NEEDLE) IMPLANT
NEEDLE HYPO 22GX1.5 SAFETY (NEEDLE) ×6 IMPLANT
NS IRRIG 1000ML POUR BTL (IV SOLUTION) ×3 IMPLANT
PACK ORTHO EXTREMITY (CUSTOM PROCEDURE TRAY) ×3 IMPLANT
PACK TOTAL JOINT (CUSTOM PROCEDURE TRAY) ×3 IMPLANT
PAD ARMBOARD 7.5X6 YLW CONV (MISCELLANEOUS) ×6 IMPLANT
PAD CAST 4YDX4 CTTN HI CHSV (CAST SUPPLIES) ×2 IMPLANT
PADDING CAST COTTON 4X4 STRL (CAST SUPPLIES) ×4
PADDING CAST COTTON 6X4 STRL (CAST SUPPLIES) ×3 IMPLANT
PATELLA MEDIAL ATTUN 35MM KNEE (Knees) ×3 IMPLANT
PIN STEINMAN FIXATION KNEE (PIN) ×3 IMPLANT
PIN THREADED HEADED SIGMA (PIN) ×3 IMPLANT
SET HNDPC FAN SPRY TIP SCT (DISPOSABLE) ×1 IMPLANT
SPONGE LAP 4X18 RFD (DISPOSABLE) ×6 IMPLANT
STAPLER VISISTAT 35W (STAPLE) ×3 IMPLANT
STRIP CLOSURE SKIN 1/2X4 (GAUZE/BANDAGES/DRESSINGS) ×2 IMPLANT
SUCTION FRAZIER HANDLE 10FR (MISCELLANEOUS) ×2
SUCTION TUBE FRAZIER 10FR DISP (MISCELLANEOUS) ×1 IMPLANT
SUT MNCRL AB 3-0 PS2 18 (SUTURE) ×3 IMPLANT
SUT VIC AB 0 CT1 27 (SUTURE) ×4
SUT VIC AB 0 CT1 27XBRD ANBCTR (SUTURE) ×2 IMPLANT
SUT VIC AB 1 CT1 27 (SUTURE) ×4
SUT VIC AB 1 CT1 27XBRD ANBCTR (SUTURE) ×1 IMPLANT
SUT VIC AB 1 CT1 27XBRD ANTBC (SUTURE) ×1 IMPLANT
SUT VIC AB 2-0 CT1 27 (SUTURE) ×4
SUT VIC AB 2-0 CT1 TAPERPNT 27 (SUTURE) ×2 IMPLANT
SUT VIC AB 2-0 FS1 27 (SUTURE) ×3 IMPLANT
SYR CONTROL 10ML LL (SYRINGE) ×6 IMPLANT
TIBIA ATTUNE KNEE SYS BASE SZ6 (Knees) ×3 IMPLANT
TOWEL OR 17X24 6PK STRL BLUE (TOWEL DISPOSABLE) ×3 IMPLANT
TOWEL OR 17X26 10 PK STRL BLUE (TOWEL DISPOSABLE) ×3 IMPLANT
TRAY CATH 16FR W/PLASTIC CATH (SET/KITS/TRAYS/PACK) IMPLANT
TRAY FOLEY CATH SILVER 16FR (SET/KITS/TRAYS/PACK) ×3 IMPLANT
TRAY FOLEY W/BAG SLVR 16FR (SET/KITS/TRAYS/PACK) ×2
TRAY FOLEY W/BAG SLVR 16FR ST (SET/KITS/TRAYS/PACK) ×1 IMPLANT
TUBE CONNECTING 12'X1/4 (SUCTIONS) ×1
TUBE CONNECTING 12X1/4 (SUCTIONS) ×2 IMPLANT
UNDERPAD 30X30 (UNDERPADS AND DIAPERS) ×3 IMPLANT
WATER STERILE IRR 1000ML POUR (IV SOLUTION) ×3 IMPLANT

## 2018-09-04 NOTE — Care Plan (Signed)
Ortho Bundle Case Management Note  Patient Details  Name: Jackie Cooke MRN: [id number] Date of Birth: [birth year]  Spoke with patient. Will discharge to home with family and HHPT. Has equipment at home. CPM ordered.                 DME Arranged:  CPM DME Agency:  [employer]  HH Arranged:  PT HH Agency:  [facility]  Additional Comments: Please contact me with any questions of if this plan should need to change.  Shauna Hugh,  RN,BSN,MHA,CCM  [facility] Orthopaedic Specialist  [phone] 09/04/2018, 10:11 AM

## 2018-09-04 NOTE — Anesthesia Procedure Notes (Signed)
Procedure Name: MAC Date/Time: 09/04/2018 7:42 AM Performed by: Imagene Riches, CRNA Pre-anesthesia Checklist: Patient identified, Emergency Drugs available, Suction available and Patient being monitored Oxygen Delivery Method: Simple face mask

## 2018-09-04 NOTE — Evaluation (Signed)
Physical Therapy Evaluation Patient Details Name: Jackie Cooke MRN: [id number] DOB: 1951-07-19 Today's Date: 09/04/2018   History of Present Illness  Pt is a 67 y/o female s/p elective R TKA. PMH includes L TKA and HTN.   Clinical Impression  Pt is s/p surgery above with deficits below. Pt guarded throughout gait, however, overall tolerated mobility well. Required min guard A with use of RW. Reviewed knee precautions with pt. Will continue to follow acutely to maximize functional mobility independence and safety.     Follow Up Recommendations Follow surgeon's recommendation for DC plan and follow-up therapies;Supervision for mobility/OOB    Equipment Recommendations  None recommended by PT    Recommendations for Other Services       Precautions / Restrictions Precautions Precautions: Knee Precaution Booklet Issued: Yes (comment) Precaution Comments: Reviewed knee precautions with pt  Required Braces or Orthoses: Knee Immobilizer - Right Knee Immobilizer - Right: Other (comment)(until good quad control ) Restrictions Weight Bearing Restrictions: Yes RLE Weight Bearing: Weight bearing as tolerated      Mobility  Bed Mobility Overal bed mobility: Needs Assistance Bed Mobility: Supine to Sit     Supine to sit: Supervision     General bed mobility comments: Increased time to come to EOB, however, no physical assist required. Supervision for catheter management.   Transfers Overall transfer level: Needs assistance Equipment used: Rolling walker (2 wheeled) Transfers: Sit to/from Stand Sit to Stand: Min guard         General transfer comment: Min guard for steadying assist. Demonstrated safe hand placement.   Ambulation/Gait Ambulation/Gait assistance: Min guard Gait Distance (Feet): 50 Feet Assistive device: Rolling walker (2 wheeled) Gait Pattern/deviations: Step-to pattern;Decreased step length - right;Decreased step length - left;Decreased weight shift to  right;Antalgic Gait velocity: Decreased    General Gait Details: Very slow, antalgic gait. Guarded throughout and heavy reliance on UEs when taking step with RLE.   Stairs            Wheelchair Mobility    Modified Rankin (Stroke Patients Only)       Balance Overall balance assessment: Needs assistance Sitting-balance support: No upper extremity supported;Feet supported Sitting balance-Leahy Scale: Good     Standing balance support: Bilateral upper extremity supported;During functional activity Standing balance-Leahy Scale: Poor Standing balance comment: Reliant on BUE support                              Pertinent Vitals/Pain Pain Assessment: 0-10 Pain Score: 5  Pain Location: R knee Pain Descriptors / Indicators: Aching;Operative site guarding Pain Intervention(s): Monitored during session;Limited activity within patient's tolerance;Repositioned    Home Living Family/patient expects to be discharged to:: Private residence Living Arrangements: Spouse/significant other Available Help at Discharge: Family;Available 24 hours/day Type of Home: House Home Access: Stairs to enter Entrance Stairs-Rails: Left Entrance Stairs-Number of Steps: 3 Home Layout: One level Home Equipment: Walker - 2 wheels;Bedside commode;Shower seat      Prior Function Level of Independence: Independent               Hand Dominance   Dominant Hand: Right    Extremity/Trunk Assessment   Upper Extremity Assessment Upper Extremity Assessment: Overall WFL for tasks assessed    Lower Extremity Assessment Lower Extremity Assessment: RLE deficits/detail RLE Deficits / Details: Deficits consistent with post op pain and weakness.     Cervical / Trunk Assessment Cervical / Trunk Assessment: Normal  Communication  Communication: No difficulties  Cognition Arousal/Alertness: Awake/alert Behavior During Therapy: WFL for tasks assessed/performed Overall Cognitive  Status: Within Functional Limits for tasks assessed                                        General Comments      Exercises     Assessment/Plan    PT Assessment Patient needs continued PT services  PT Problem List Decreased strength;Decreased range of motion;Decreased balance;Decreased mobility;Decreased knowledge of use of DME;Pain       PT Treatment Interventions DME instruction;Gait training;Functional mobility training;Stair training;Therapeutic exercise;Therapeutic activities;Balance training;Neuromuscular re-education;Wheelchair mobility training    PT Goals (Current goals can be found in the Care Plan section)  Acute Rehab PT Goals Patient Stated Goal: to go home PT Goal Formulation: With patient Time For Goal Achievement: 09/18/18 Potential to Achieve Goals: Good    Frequency 7X/week   Barriers to discharge        Co-evaluation               AM-PAC PT "6 Clicks" Daily Activity  Outcome Measure Difficulty turning over in bed (including adjusting bedclothes, sheets and blankets)?: A Little Difficulty moving from lying on back to sitting on the side of the bed? : A Little Difficulty sitting down on and standing up from a chair with arms (e.g., wheelchair, bedside commode, etc,.)?: Unable Help needed moving to and from a bed to chair (including a wheelchair)?: A Little Help needed walking in hospital room?: A Little Help needed climbing 3-5 steps with a railing? : A Lot 6 Click Score: 15    End of Session Equipment Utilized During Treatment: Gait belt;Right knee immobilizer Activity Tolerance: Patient tolerated treatment well Patient left: in chair;with call bell/phone within reach Nurse Communication: Mobility status PT Visit Diagnosis: Other abnormalities of gait and mobility (R26.89);Muscle weakness (generalized) (M62.81);Pain Pain - Right/Left: Right Pain - part of body: Knee    Time: 1610-9604 PT Time Calculation (min) (ACUTE ONLY):  23 min   Charges:   PT Evaluation $PT Eval Low Complexity: 1 Low PT Treatments $Gait Training: 8-22 mins        Gladys Damme, PT, DPT  Acute Rehabilitation Services  Pager: [phone] Office: [phone]   Lehman Prom 09/04/2018, 6:13 PM

## 2018-09-04 NOTE — Transfer of Care (Signed)
Immediate Anesthesia Transfer of Care Note  Patient: Jackie Cooke  Procedure(s) Performed: TOTAL KNEE ARTHROPLASTY (Right Knee) HARDWARE REMOVAL (Right Knee)  Patient Location: PACU  Anesthesia Type:General  Level of Consciousness: awake, alert  and oriented  Airway & Oxygen Therapy: Patient Spontanous Breathing and Patient connected to face mask oxygen  Post-op Assessment: Report given to RN and Post -op Vital signs reviewed and stable  Post vital signs: Reviewed and stable  Last Vitals:  Vitals Value Taken Time  BP 102/51 09/04/2018  9:37 AM  Temp 36.4 C 09/04/2018  9:37 AM  Pulse 67 09/04/2018  9:39 AM  Resp 20 09/04/2018  9:39 AM  SpO2 96 % 09/04/2018  9:39 AM  Vitals shown include unvalidated device data.  Last Pain:  Vitals:   09/04/18 0610  TempSrc: Oral         Complications: No apparent anesthesia complications

## 2018-09-04 NOTE — Op Note (Signed)
MRN:     [id number] DOB/AGE:    [birth year] / 67 y.o.       OPERATIVE REPORT   DATE OF PROCEDURE:  09/04/2018      PREOPERATIVE DIAGNOSIS:   Primary Localized Osteoarthritis right Knee  Retained hardware Right knee      Estimated body mass index is 28.37 kg/m as calculated from the following:   Height as of this encounter: 5\' 8"  (1.727 m).   Weight as of this encounter: 84.6 kg.                                                       POSTOPERATIVE DIAGNOSIS:   Same                                                                 PROCEDURE:  Procedure(s): TOTAL KNEE ARTHROPLASTY HARDWARE REMOVAL Using Depuy Attune RP implants #5 Femur, #6Tibia, 6mm  RP bearing, 35 Patella    SURGEON: Bernadette Armijo A. Thurston Hole, MD   ASSISTANT: Julien Girt, PA-C, present and scrubbed throughout the case, critical for retraction, instrumentation, and closure.  ANESTHESIA: Spinal with Adductor Nerve Block  TOURNIQUET TIME: 60 minutes   COMPLICATIONS:  None       SPECIMENS: None   INDICATIONS FOR PROCEDURE: The patient has djd of the knee with varus deformities, XR shows bone on bone arthritis. Patient has failed all conservative measures including anti-inflammatory medicines, narcotics, attempts at exercise and weight loss, cortisone injections and viscosupplementation.  Risks and benefits of surgery have been discussed, questions answered.    DESCRIPTION OF PROCEDURE: The patient identified by armband, received right adductor canal block and IV antibiotics, in the holding area at [facility]. Patient taken to the operating room, appropriate anesthetic monitors were attached. Spinal anesthesia induced with the patient in supine position, Foley catheter was inserted. Tourniquet applied high to the operative thigh. Lateral post and foot positioner applied to the table, the lower extremity was then prepped and draped in usual sterile fashion from the ankle to the tourniquet. Time-out procedure was  performed. The limb was wrapped with an Esmarch bandage and the tourniquet inflated to 365 mmHg.   We began the operation by making a 6cm anterior midline incision. Small bleeders in the skin and the subcutaneous tissue identified and cauterized. Transverse retinaculum was incised and reflected medially and a medial parapatellar arthrotomy was accomplished. the patella was everted and theprepatellar fat pad resected. The hardware from her previous ORIF of her patella with 2 screws and wires were then removed. The superficial medial collateral ligament was then elevated from anterior to posterior along the proximal flare of the tibia and anterior half of the menisci resected. The knee was hyperflexed exposing bone on bone arthritis. Peripheral and notch osteophytes as well as the cruciate ligaments were then resected. We continued to work our way around posteriorly along the proximal tibia, and externally rotated the tibia subluxing it out from underneath the femur. A McHale retractor was placed through the notch and a lateral Hohmann retractor placed, and an external tibial guide was placed.  The tibial cutting  guide was pinned into place allowing resection of 6 mm of bone medially and about 2 mm of bone laterally because of her valgus deformity.   Satisfied with the tibial resection, we then entered the distal femur 2 mm anterior to the PCL origin with the intramedullary guide rod and applied the distal femoral cutting guide set at 11mm, with 5 degrees of valgus. This was pinned along the epicondylar axis. At this point, the distal femoral cut was accomplished without difficulty. We then sized for a 5 femoral component and pinned the guide in 3 degrees of external rotation.The chamfer cutting guide was pinned into place. The anterior, posterior, and chamfer cuts were accomplished without difficulty followed by the  RP box cutting guide and the box cut. We also removed posterior osteophytes from the posterior  femoral condyles. At this time, the knee was brought into full extension. We checked our extension and flexion gaps and found them symmetric at 6.  The patella thickness measured at 50m m. We set the cutting guide at 15 and removed the posterior patella sized for 35 button and drilled the lollipop. The knee was then once again hyperflexed exposing the proximal tibia. We sized for a # 6 tibial base plate, applied the smokestack and the conical reamer followed by the the Delta fin keel punch. We then hammered into place the  RP trial femoral component, inserted a trial bearing, trial patellar button, and took the knee through range of motion from 0-130 degrees. No thumb pressure was required for patellar tracking.   At this point, all trial components were removed, a double batch of DePuy HV cement  was mixed and applied to all bony metallic mating surfaces. In order, we hammered into place the tibial tray and removed excess cement, the femoral component and removed excess cement, a 6 mm  RP bearing was inserted, and the knee brought to full extension with compression. The patellar button was clamped into place, and excess cement removed. While the cement cured the wound was irrigated out with normal saline solution pulse lavage, and exparel was injected throughout the knee. Ligament stability and patellar tracking were checked and found to be excellent..   The parapatellar arthrotomy was closed with  #1 Vicryl suture. The subcutaneous tissue with 0 and 2-0 undyed Vicryl suture, and 4-0 Monocryl.. A dressing of Aquaseal, 4 x 4, dressing sponges, Webril, and Ace wrap applied. Needle and sponge count were correct times 2.The patient awakened, extubated, and taken to recovery room without difficulty. Vascular status was normal, pulses 2+ and symmetric.    Jackie Cooke 01/16/2018, 8:56 AM

## 2018-09-04 NOTE — Interval H&P Note (Signed)
History and Physical Interval Note:  09/04/2018 6:34 AM  Jackie Cooke  has presented today for surgery, with the diagnosis of DJD RIGHT KNEE  The various methods of treatment have been discussed with the patient and family. After consideration of risks, benefits and other options for treatment, the patient has consented to  Procedure(s): TOTAL KNEE ARTHROPLASTY (Right) HARDWARE REMOVAL (Right) as a surgical intervention .  The patient's history has been reviewed, patient examined, no change in status, stable for surgery.  I have reviewed the patient's chart and labs.  Questions were answered to the patient's satisfaction.     [name]

## 2018-09-04 NOTE — Progress Notes (Signed)
Orthopedic Tech Progress Note Patient Details:  Jackie Cooke 02/24/1951 [id number]  CPM Right Knee CPM Right Knee: On Right Knee Flexion (Degrees): 90 Right Knee Extension (Degrees): 0 Additional Comments: foot roll  Post Interventions Patient Tolerated: Well Instructions Provided: Care of device  Saul Fordyce 09/04/2018, 11:02 AM

## 2018-09-04 NOTE — Anesthesia Procedure Notes (Signed)
Spinal  Patient location during procedure: OR Start time: 09/04/2018 7:13 AM End time: 09/04/2018 7:23 AM Staffing Anesthesiologist: Elmer Picker, MD Performed: anesthesiologist  Preanesthetic Checklist Completed: patient identified, surgical consent, pre-op evaluation, timeout performed, IV checked, risks and benefits discussed and monitors and equipment checked Spinal Block Patient position: sitting Prep: site prepped and draped and DuraPrep Patient monitoring: cardiac monitor, continuous pulse ox and blood pressure Approach: midline Location: L3-4 Injection technique: single-shot Needle Needle type: Pencan  Needle gauge: 24 G Needle length: 9 cm Assessment Sensory level: T6 Additional Notes Functioning IV was confirmed and monitors were applied. Sterile prep and drape, including hand hygiene and sterile gloves were used. The patient was positioned and the spine was prepped. The skin was anesthetized with lidocaine.  Free flow of clear CSF was obtained prior to injecting local anesthetic into the CSF.  The spinal needle aspirated freely following injection.  The needle was carefully withdrawn.  The patient tolerated the procedure well.

## 2018-09-04 NOTE — Anesthesia Procedure Notes (Addendum)
Anesthesia Regional Block: Adductor canal block   Pre-Anesthetic Checklist: ,, timeout performed, Correct Patient, Correct Site, Correct Laterality, Correct Procedure, Correct Position, site marked, Risks and benefits discussed,  Surgical consent,  Pre-op evaluation,  At surgeon's request and post-op pain management  Laterality: Right  Prep: Maximum Sterile Barrier Precautions used, chloraprep       Needles:  Injection technique: Single-shot  Needle Type: Echogenic Stimulator Needle     Needle Length: 9cm  Needle Gauge: 22     Additional Needles:   Procedures:,,,, ultrasound used (permanent image in chart),,,,  Narrative:  Start time: 09/04/2018 6:51 AM End time: 09/04/2018 7:01 AM Injection made incrementally with aspirations every 5 mL.  Performed by: Personally  Anesthesiologist: Elmer Picker, MD  Additional Notes: Monitors applied. No increased pain on injection. No increased resistance to injection. Injection made in 5cc increments. Good needle visualization. Patient tolerated procedure well.

## 2018-09-05 ENCOUNTER — Encounter (HOSPITAL_COMMUNITY): Payer: Self-pay | Admitting: Orthopedic Surgery

## 2018-09-05 DIAGNOSIS — Z886 Allergy status to analgesic agent status: Secondary | ICD-10-CM | POA: Diagnosis not present

## 2018-09-05 DIAGNOSIS — Z90710 Acquired absence of both cervix and uterus: Secondary | ICD-10-CM | POA: Diagnosis not present

## 2018-09-05 DIAGNOSIS — Z96641 Presence of right artificial hip joint: Secondary | ICD-10-CM | POA: Diagnosis present

## 2018-09-05 DIAGNOSIS — I10 Essential (primary) hypertension: Secondary | ICD-10-CM | POA: Diagnosis present

## 2018-09-05 DIAGNOSIS — M1711 Unilateral primary osteoarthritis, right knee: Secondary | ICD-10-CM | POA: Diagnosis present

## 2018-09-05 DIAGNOSIS — M25761 Osteophyte, right knee: Secondary | ICD-10-CM | POA: Diagnosis present

## 2018-09-05 DIAGNOSIS — Z833 Family history of diabetes mellitus: Secondary | ICD-10-CM | POA: Diagnosis not present

## 2018-09-05 DIAGNOSIS — M2100 Valgus deformity, not elsewhere classified, unspecified site: Secondary | ICD-10-CM | POA: Diagnosis present

## 2018-09-05 DIAGNOSIS — Z7952 Long term (current) use of systemic steroids: Secondary | ICD-10-CM | POA: Diagnosis not present

## 2018-09-05 DIAGNOSIS — Z23 Encounter for immunization: Secondary | ICD-10-CM | POA: Diagnosis present

## 2018-09-05 DIAGNOSIS — Z8249 Family history of ischemic heart disease and other diseases of the circulatory system: Secondary | ICD-10-CM | POA: Diagnosis not present

## 2018-09-05 DIAGNOSIS — Z96652 Presence of left artificial knee joint: Secondary | ICD-10-CM | POA: Diagnosis present

## 2018-09-05 DIAGNOSIS — Z87891 Personal history of nicotine dependence: Secondary | ICD-10-CM | POA: Diagnosis not present

## 2018-09-05 DIAGNOSIS — K219 Gastro-esophageal reflux disease without esophagitis: Secondary | ICD-10-CM | POA: Diagnosis present

## 2018-09-05 DIAGNOSIS — E039 Hypothyroidism, unspecified: Secondary | ICD-10-CM | POA: Diagnosis present

## 2018-09-05 DIAGNOSIS — Z885 Allergy status to narcotic agent status: Secondary | ICD-10-CM | POA: Diagnosis not present

## 2018-09-05 DIAGNOSIS — Z808 Family history of malignant neoplasm of other organs or systems: Secondary | ICD-10-CM | POA: Diagnosis not present

## 2018-09-05 DIAGNOSIS — Z79890 Hormone replacement therapy (postmenopausal): Secondary | ICD-10-CM | POA: Diagnosis not present

## 2018-09-05 LAB — BASIC METABOLIC PANEL
Anion gap: 6 (ref 5–15)
BUN: 11 mg/dL (ref 8–23)
CALCIUM: 8.8 mg/dL — AB (ref 8.9–10.3)
CHLORIDE: 103 mmol/L (ref 98–111)
CO2: 28 mmol/L (ref 22–32)
CREATININE: 0.81 mg/dL (ref 0.44–1.00)
GFR calc non Af Amer: >60 mL/min (ref >60)
Glucose, Bld: 144 mg/dL — ABNORMAL HIGH (ref 70–99)
Potassium: 4.8 mmol/L (ref 3.5–5.1)
SODIUM: 137 mmol/L (ref 135–145)

## 2018-09-05 LAB — CBC
HCT: 35 % — ABNORMAL LOW (ref 36.0–46.0)
HEMOGLOBIN: 11.4 g/dL — AB (ref 12.0–15.0)
MCH: 31.6 pg (ref 26.0–34.0)
MCHC: 32.6 g/dL (ref 30.0–36.0)
MCV: 97 fL (ref 80.0–100.0)
Platelets: 273 10*3/uL (ref 150–400)
RBC: 3.61 MIL/uL — ABNORMAL LOW (ref 3.87–5.11)
RDW: 13.7 % (ref 11.5–15.5)
WBC: 9.7 10*3/uL (ref 4.0–10.5)
nRBC: 0 % (ref 0.0–0.2)

## 2018-09-05 MED ORDER — SODIUM CHLORIDE 0.9 % IV BOLUS
500.0000 mL | Freq: Once | INTRAVENOUS | Status: AC
  Administered 2018-09-05: 500 mL via INTRAVENOUS

## 2018-09-05 NOTE — Progress Notes (Signed)
Subjective: 1 Day Post-Op Procedure(s) (LRB): TOTAL KNEE ARTHROPLASTY (Right) HARDWARE REMOVAL (Right) Patient reports pain as 5 on 0-10 scale.    Objective: Vital signs in last 24 hours: Temp:  [97.4 F (36.3 C)-97.9 F (36.6 C)] 97.5 F (36.4 C) (11/12 0403) Pulse Rate:  [60-71] 64 (11/12 0403) Resp:  [11-16] 14 (11/12 0403) BP: (102-129)/(51-72) 122/63 (11/12 0403) SpO2:  [91 %-100 %] 95 % (11/12 0403)  Intake/Output from previous day: 11/11 0701 - 11/12 0700 In: 3550 [P.O.:1080; I.V.:2320; IV Piggyback:100] Out: 3200 [Urine:3150; Blood:50] Intake/Output this shift: Total I/O In: -  Out: 600 [Urine:600]  Recent Labs    09/05/18 0143  HGB 11.4*   Recent Labs    09/05/18 0143  WBC 9.7  RBC 3.61*  HCT 35.0*  PLT 273   Recent Labs    09/05/18 0143  NA 137  K 4.8  CL 103  CO2 28  BUN 11  CREATININE 0.81  GLUCOSE 144*  CALCIUM 8.8*   No results for input(s): LABPT, INR in the last 72 hours.  Neurologically intact ABD soft Sensation intact distally Intact pulses distally Dorsiflexion/Plantar flexion intact Incision: no drainage  Anticipated LOS equal to or greater than 2 midnights due to - Age 26 and older with one or more of the following:  - Obesity  - Expected need for hospital services (PT, OT, Nursing) required for safe  discharge  - Anticipated need for postoperative skilled nursing care or inpatient rehab  - Active co-morbidities: None OR   - Unanticipated findings during/Post Surgery: This patient is have significan difficulty with pain control still requiring IV pain medication and also had difficulty with lightheadedness and dizziness with ambulation with me this am  - Patient is a high risk of re-admission due to: None   Assessment/Plan: 1 Day Post-Op Procedure(s) (LRB): TOTAL KNEE ARTHROPLASTY (Right) HARDWARE REMOVAL (Right)  Principal Problem:   Primary localized osteoarthritis of right knee Active Problems:   Essential  hypertension, benign   Hypothyroidism   Painful retained orthopaedic hardware right knee(HCC)   S/P total knee replacement, left  Advance diet Up with therapy  This patient is have significant difficulty with pain control still requiring IV pain medication and also had difficulty with lightheadedness and dizziness with ambulation with me this am.  Therefore It is medically necessary for her to be admitted as an inpatient and stay in the hospital another night.    Yannick Steuber J Alexes Lamarque 09/05/2018, 9:24 AM

## 2018-09-05 NOTE — Progress Notes (Signed)
Orthopedic Tech Progress Note Patient Details:  Jackie DuvalMarsha S Cooke 06-29-1951 [id number]  CPM Right Knee CPM Right Knee: On Right Knee Flexion (Degrees): 90 Right Knee Extension (Degrees): 0 Additional Comments: foot roll  Post Interventions Patient Tolerated: Well Instructions Provided: Care of device  Saul FordyceJennifer C Merion Caton 09/05/2018, 4:03 AM

## 2018-09-05 NOTE — Care Management Note (Addendum)
Case Management Note  Patient Details  Name: Mariane DuvalMarsha S Vaccarella MRN: [id number] Date of Birth: 02/08/1951  Subjective/Objective:    R TKA                Action/Plan: [facility] (preoperatively arranged) Has RW and 3n1 at home. [facility] will deliver a CPM to home at dc.   Expected Discharge Date:                 Expected Discharge Plan:  Home w Home Health Services  In-House Referral:  NA  Discharge planning Services  CM Consult  Post Acute Care Choice:  Home Health Choice offered to:  Patient  DME Arranged:  CPM DME Agency:  [employer]  HH Arranged:  PT HH Agency:  [facility]  Status of Service:  Completed, signed off  If discussed at [employer] Meetings, dates discussed:    Additional Comments:  Elliot CousinShavis, Anwita Mencer Ellen, RN 09/05/2018, 11:49 AM

## 2018-09-05 NOTE — Progress Notes (Signed)
Physical Therapy Treatment Patient Details Name: Jackie DuvalMarsha S Castelli MRN: [id number] DOB: 10-Jan-1951 Today's Date: 09/05/2018    History of Present Illness Pt is a 67 y/o female s/p elective R TKA. PMH includes L TKA and HTN.     PT Comments    Patient seen for mobility progression. Pt is able to ambulate 150 ft with RW and requires min guard for safety with transfers and gait training. Pt premedicated prior to arrival and requesting IV pain medication during session. Pt with c/o dizziness and nausea end of session and requested returning to bed. Pt encouraged to spend most of her time OOB during the day and to maintain R knee in extension while at rest. Continue to progress as tolerated.    Follow Up Recommendations  Follow surgeon's recommendation for DC plan and follow-up therapies;Supervision for mobility/OOB     Equipment Recommendations  None recommended by PT    Recommendations for Other Services       Precautions / Restrictions Precautions Precautions: Knee Precaution Comments: Reviewed knee precautions/positioning  Required Braces or Orthoses: Knee Immobilizer - Right Knee Immobilizer - Right: Other (comment)(until good quad control ) Restrictions Weight Bearing Restrictions: Yes RLE Weight Bearing: Weight bearing as tolerated    Mobility  Bed Mobility Overal bed mobility: Needs Assistance Bed Mobility: Supine to Sit;Sit to Supine     Supine to sit: Modified independent (Device/Increase time) Sit to supine: Supervision   General bed mobility comments: increased time and effort  Transfers Overall transfer level: Needs assistance Equipment used: Rolling walker (2 wheeled) Transfers: Sit to/from Stand Sit to Stand: Min guard         General transfer comment: Min guard for safety from EOB and BSC    Ambulation/Gait Ambulation/Gait assistance: Min guard Gait Distance (Feet): 150 Feet Assistive device: Rolling walker (2 wheeled) Gait Pattern/deviations:  Step-to pattern;Step-through pattern;Decreased stance time - right;Decreased step length - left;Decreased stride length;Decreased weight shift to right Gait velocity: Decreased    General Gait Details: slow, guarded gait; cues for safe use of AD/proximity to RW, increased R knee flexion during swing phase, and step length symmetry; pt with improving step through pattern with distance    Stairs             Wheelchair Mobility    Modified Rankin (Stroke Patients Only)       Balance Overall balance assessment: Needs assistance Sitting-balance support: No upper extremity supported;Feet supported Sitting balance-Leahy Scale: Good     Standing balance support: Bilateral upper extremity supported;During functional activity Standing balance-Leahy Scale: Poor Standing balance comment: Reliant on BUE support                             Cognition Arousal/Alertness: Awake/alert Behavior During Therapy: WFL for tasks assessed/performed Overall Cognitive Status: Within Functional Limits for tasks assessed                                        Exercises      General Comments General comments (skin integrity, edema, etc.): pt c/o dizziness/nausea toward end of session but requesting IV pain medication; pt requested to return to bed but pt encouraged to spend most of time OOB during the day; husband present      Pertinent Vitals/Pain Pain Assessment: Faces Faces Pain Scale: Hurts little more Pain Location: R knee Pain Descriptors /  Indicators: Guarding;Sore Pain Intervention(s): Monitored during session;Premedicated before session;Repositioned;Patient requesting pain meds-RN notified    Home Living                      Prior Function            PT Goals (current goals can now be found in the care plan section) Acute Rehab PT Goals Patient Stated Goal: to go home Progress towards PT goals: Progressing toward goals    Frequency     7X/week      PT Plan Current plan remains appropriate    Co-evaluation              AM-PAC PT "6 Clicks" Daily Activity  Outcome Measure  Difficulty turning over in bed (including adjusting bedclothes, sheets and blankets)?: A Little Difficulty moving from lying on back to sitting on the side of the bed? : A Little Difficulty sitting down on and standing up from a chair with arms (e.g., wheelchair, bedside commode, etc,.)?: Unable Help needed moving to and from a bed to chair (including a wheelchair)?: A Little Help needed walking in hospital room?: A Little Help needed climbing 3-5 steps with a railing? : A Lot 6 Click Score: 15    End of Session Equipment Utilized During Treatment: Gait belt Activity Tolerance: Patient tolerated treatment well Patient left: with call bell/phone within reach;in bed;with family/visitor present Nurse Communication: Mobility status PT Visit Diagnosis: Other abnormalities of gait and mobility (R26.89);Muscle weakness (generalized) (M62.81);Pain Pain - Right/Left: Right Pain - part of body: Knee     Time: 1032-1109 PT Time Calculation (min) (ACUTE ONLY): 37 min  Charges:  $Gait Training: 23-37 mins                     Erline Levine, PTA Acute Rehabilitation Services Pager: [phone] Office: [phone]     Carolynne Edouard 09/05/2018, 11:21 AM

## 2018-09-05 NOTE — Anesthesia Postprocedure Evaluation (Signed)
Anesthesia Post Note  Patient: Jackie DuvalMarsha S Cooke  Procedure(s) Performed: TOTAL KNEE ARTHROPLASTY (Right Knee) HARDWARE REMOVAL (Right Knee)     Patient location during evaluation: PACU Anesthesia Type: Regional and Spinal Level of consciousness: oriented and awake and alert Pain management: pain level controlled Vital Signs Assessment: post-procedure vital signs reviewed and stable Respiratory status: spontaneous breathing, respiratory function stable and patient connected to nasal cannula oxygen Cardiovascular status: blood pressure returned to baseline and stable Postop Assessment: no headache, no backache and no apparent nausea or vomiting Anesthetic complications: no    Last Vitals:  Vitals:   09/05/18 0047 09/05/18 0403  BP: 123/69 122/63  Pulse: 60 64  Resp: 16 14  Temp: 36.6 C (!) 36.4 C  SpO2: 94% 95%    Last Pain:  Vitals:   09/05/18 1610  TempSrc:   PainSc: 0-No pain                 Lataunya Ruud L Thaddus Mcdowell

## 2018-09-05 NOTE — Progress Notes (Signed)
Orthopedic Tech Progress Note Patient Details:  Jackie DuvalMarsha S Tailor September 30, 1951 [id number]  CPM Right Knee CPM Right Knee: Off Right Knee Flexion (Degrees): 90 Right Knee Extension (Degrees): 0 Additional Comments: foot roll  Post Interventions Patient Tolerated: Well Instructions Provided: Care of device  Saul FordyceJennifer C Jeffree Cazeau 09/05/2018, 4:03 AM

## 2018-09-05 NOTE — Progress Notes (Addendum)
Physical Therapy Treatment Patient Details Name: Jackie Cooke MRN: [id number] DOB: December 11, 1950 Today's Date: 09/05/2018    History of Present Illness Pt is a 67 y/o female s/p elective R TKA. PMH includes L TKA and HTN.     PT Comments    Patient continues to make progress toward PT goals. Pt tolerated gait and stair training well with min guard/min A. Continue to progress as tolerated.     Follow Up Recommendations  Follow surgeon's recommendation for DC plan and follow-up therapies;Supervision for mobility/OOB     Equipment Recommendations  None recommended by PT    Recommendations for Other Services       Precautions / Restrictions Precautions Precautions: Knee Precaution Comments: Reviewed knee precautions/positioning  Restrictions Weight Bearing Restrictions: Yes RLE Weight Bearing: Weight bearing as tolerated    Mobility  Bed Mobility               General bed mobility comments: pt OOB in chair upon arrival   Transfers Overall transfer level: Needs assistance Equipment used: Rolling walker (2 wheeled) Transfers: Sit to/from Stand Sit to Stand: Min guard         General transfer comment: for safety; cues for safe use of AD   Ambulation/Gait Ambulation/Gait assistance: Min guard Gait Distance (Feet): 150 Feet Assistive device: Rolling walker (2 wheeled) Gait Pattern/deviations: Step-through pattern;Decreased stride length;Decreased stance time - right;Decreased step length - left;Decreased weight shift to right Gait velocity: Decreased    General Gait Details: cues for proximity to RW and safe use of AD    Stairs Stairs: Yes Stairs assistance: Min assist Stair Management: One rail Left;With cane Number of Stairs: (2 steps X 3) General stair comments: cues for sequencing and technique; min A for balance    Wheelchair Mobility    Modified Rankin (Stroke Patients Only)       Balance Overall balance assessment: Needs  assistance Sitting-balance support: No upper extremity supported;Feet supported Sitting balance-Leahy Scale: Good     Standing balance support: Bilateral upper extremity supported;During functional activity Standing balance-Leahy Scale: Poor Standing balance comment: Reliant on BUE support                             Cognition Arousal/Alertness: Awake/alert Behavior During Therapy: WFL for tasks assessed/performed Overall Cognitive Status: Within Functional Limits for tasks assessed                                        Exercises      General Comments        Pertinent Vitals/Pain Pain Assessment: Faces Faces Pain Scale: Hurts little more Pain Location: R knee Pain Descriptors / Indicators: Sore Pain Intervention(s): Limited activity within patient's tolerance;Monitored during session;Premedicated before session;Repositioned    Home Living                      Prior Function            PT Goals (current goals can now be found in the care plan section) Acute Rehab PT Goals Patient Stated Goal: to go home Progress towards PT goals: Progressing toward goals    Frequency    7X/week      PT Plan Current plan remains appropriate    Co-evaluation  AM-PAC PT "6 Clicks" Daily Activity  Outcome Measure  Difficulty turning over in bed (including adjusting bedclothes, sheets and blankets)?: A Little Difficulty moving from lying on back to sitting on the side of the bed? : A Little Difficulty sitting down on and standing up from a chair with arms (e.g., wheelchair, bedside commode, etc,.)?: Unable Help needed moving to and from a bed to chair (including a wheelchair)?: A Little Help needed walking in hospital room?: A Little Help needed climbing 3-5 steps with a railing? : A Little 6 Click Score: 16    End of Session Equipment Utilized During Treatment: Gait belt Activity Tolerance: Patient tolerated treatment  well Patient left: with call bell/phone within reach;in chair Nurse Communication: Mobility status PT Visit Diagnosis: Other abnormalities of gait and mobility (R26.89);Muscle weakness (generalized) (M62.81);Pain Pain - Right/Left: Right Pain - part of body: Knee     Time: [id number] PT Time Calculation (min) (ACUTE ONLY): 26 min  Charges:  $Gait Training: 23-37 mins                     Erline LevineKellyn Jolly Bleicher, PTA Acute Rehabilitation Services Pager: [phone](336) [phone] Office: [phone]     Carolynne EdouardKellyn R Romonia Yanik 09/05/2018, 3:48 PM

## 2018-09-06 LAB — CBC
HCT: 32.7 % — ABNORMAL LOW (ref 36.0–46.0)
Hemoglobin: 10.5 g/dL — ABNORMAL LOW (ref 12.0–15.0)
MCH: 31.3 pg (ref 26.0–34.0)
MCHC: 32.1 g/dL (ref 30.0–36.0)
MCV: 97.3 fL (ref 80.0–100.0)
NRBC: 0 % (ref 0.0–0.2)
PLATELETS: 273 10*3/uL (ref 150–400)
RBC: 3.36 MIL/uL — ABNORMAL LOW (ref 3.87–5.11)
RDW: 14.1 % (ref 11.5–15.5)
WBC: 12 10*3/uL — ABNORMAL HIGH (ref 4.0–10.5)

## 2018-09-06 LAB — BASIC METABOLIC PANEL
ANION GAP: 6 (ref 5–15)
BUN: 12 mg/dL (ref 8–23)
CALCIUM: 8.6 mg/dL — AB (ref 8.9–10.3)
CO2: 27 mmol/L (ref 22–32)
Chloride: 103 mmol/L (ref 98–111)
Creatinine, Ser: 0.64 mg/dL (ref 0.44–1.00)
GFR calc Af Amer: >60 mL/min (ref >60)
GLUCOSE: 124 mg/dL — AB (ref 70–99)
Potassium: 4.5 mmol/L (ref 3.5–5.1)
Sodium: 136 mmol/L (ref 135–145)

## 2018-09-06 MED ORDER — DOCUSATE SODIUM 100 MG PO CAPS: ORAL_CAPSULE | ORAL | 0 refills | Status: DC

## 2018-09-06 MED ORDER — POLYETHYLENE GLYCOL 3350 17 G PO PACK: 17.0000 g | PACK | Freq: Two times a day (BID) | ORAL | 0 refills | Status: DC

## 2018-09-06 MED ORDER — ASPIRIN 325 MG PO TBEC: 325.0000 mg | DELAYED_RELEASE_TABLET | Freq: Every day | ORAL | 0 refills | Status: DC

## 2018-09-06 MED ORDER — OXYCODONE HCL 5 MG PO TABS: 5.0000 mg | ORAL_TABLET | ORAL | 0 refills | Status: DC | PRN

## 2018-09-06 NOTE — Plan of Care (Signed)

## 2018-09-06 NOTE — Progress Notes (Signed)
PT Cancellation Note  Patient Details Name: Jackie Cooke MRN: [id number] DOB: [birth year]   Cancelled Treatment:    Reason Eval/Treat Not Completed: Patient declined, no reason specified Attempted to see pt for PT this am and pt declined participation.    Derek MoundKellyn R Dereon Williamsen Adean Milosevic, PTA Acute Rehabilitation Services Pager: [phone] Office: [phone]   09/06/2018, 10:06 AM

## 2018-09-06 NOTE — Discharge Summary (Signed)
Patient ID: Jackie DuvalMarsha S Quiett MRN: [id number] DOB/AGE: 03/04/1951 67 y.o.  Admit date: 09/04/2018 Discharge date: 09/06/2018  Admission Diagnoses:  Principal Problem:   Primary localized osteoarthritis of right knee Active Problems:   Essential hypertension, benign   Hypothyroidism   Painful retained orthopaedic hardware right knee(HCC)   S/P total knee replacement, left   Discharge Diagnoses:  Same  Past Medical History:  Diagnosis Date  . Baker's cyst of knee, left 03/07/2018  . Essential hypertension, benign 02/20/2018  . GERD (gastroesophageal reflux disease)    at times takes over the counter meds   . Hypertension   . Hypothyroidism 02/20/2018  . Painful retained orthopaedic hardware right knee(HCC) 07/15/2018  . PONV (postoperative nausea and vomiting)   . Primary localized osteoarthritis of left knee 02/20/2018  . Primary localized osteoarthritis of right knee 07/15/2018  . S/P total knee replacement, left 07/15/2018  . Thyroid disease     Surgeries: Procedure(s): TOTAL KNEE ARTHROPLASTY HARDWARE REMOVAL on 09/04/2018   Consultants:   Discharged Condition: Improved  Hospital Course: Jackie Cooke is an 67 y.o. female who was admitted 09/04/2018 for operative treatment ofPrimary localized osteoarthritis of right knee. Patient has severe unremitting pain that affects sleep, daily activities, and work/hobbies. After pre-op clearance the patient was taken to the operating room on 09/04/2018 and underwent  Procedure(s): TOTAL KNEE ARTHROPLASTY HARDWARE REMOVAL.    Patient was given perioperative antibiotics:  Anti-infectives (From admission, onward)   Start     Dose/Rate Route Frequency Ordered Stop   09/04/18 1330  ceFAZolin (ANCEF) IVPB 2g/100 mL premix     2 g 200 mL/hr over 30 Minutes Intravenous Every 6 hours 09/04/18 1047 09/05/18 1051   09/04/18 0845  cefUROXime (ZINACEF) injection  Status:  Discontinued       As needed 09/04/18 0846 09/04/18 0934    09/04/18 0630  ceFAZolin (ANCEF) IVPB 2g/100 mL premix     2 g 200 mL/hr over 30 Minutes Intravenous On call to O.R. 09/04/18 0615 09/04/18 0735   09/04/18 0618  ceFAZolin (ANCEF) 2-4 GM/100ML-% IVPB    Note to Pharmacy:  Dia CrawfordGallman, Kathie   : cabinet override      09/04/18 0618 09/04/18 0725       Patient was given sequential compression devices, early ambulation, and chemoprophylaxis to prevent DVT.  Patient benefited maximally from hospital stay and there were no complications.    Recent vital signs:  Patient Vitals for the past 24 hrs:  BP Temp Temp src Pulse Resp SpO2  09/06/18 0357 133/73 98.4 F (36.9 C) Oral 68 16 96 %  09/05/18 1958 137/79 97.9 F (36.6 C) Oral 70 16 94 %     Recent laboratory studies:  Recent Labs    09/05/18 0143 09/06/18 0213  WBC 9.7 12.0*  HGB 11.4* 10.5*  HCT 35.0* 32.7*  PLT 273 273  NA 137 136  K 4.8 4.5  CL 103 103  CO2 28 27  BUN 11 12  CREATININE 0.81 0.64  GLUCOSE 144* 124*  CALCIUM 8.8* 8.6*     Discharge Medications:   Allergies as of 09/06/2018      Reactions   Morphine And Related Anaphylaxis, Shortness Of Breath   Celebrex [celecoxib] Hives      Medication List    STOP taking these medications   estradiol 1 MG tablet Commonly known as:  ESTRACE     TAKE these medications   acetaminophen 650 MG CR tablet Commonly known as:  TYLENOL Take  1,300 mg by mouth 2 (two) times daily.   aspirin 325 MG EC tablet Take 1 tablet (325 mg total) by mouth daily with breakfast. Start taking on:  09/07/2018   cholecalciferol 1000 units tablet Commonly known as:  VITAMIN D Take 1,000 Units by mouth daily.   docusate sodium 100 MG capsule Commonly known as:  COLACE 1 tab 2 times a day while on narcotics.  STOOL SOFTENER   escitalopram 10 MG tablet Commonly known as:  LEXAPRO Take 10 mg by mouth.   gabapentin 300 MG capsule Commonly known as:  NEURONTIN Take 1 capsule (300 mg total) by mouth at bedtime. What changed:   when to take this   levothyroxine 137 MCG tablet Commonly known as:  SYNTHROID, LEVOTHROID Take 137 mcg by mouth daily before breakfast.   losartan-hydrochlorothiazide 50-12.5 MG tablet Commonly known as:  HYZAAR Take 1 tablet by mouth daily.   NIFEdipine 30 MG 24 hr tablet Commonly known as:  PROCARDIA-XL/NIFEDICAL-XL Take 30 mg by mouth daily.   oxybutynin 5 MG 24 hr tablet Commonly known as:  DITROPAN-XL Take 5 mg by mouth daily.   oxyCODONE 5 MG immediate release tablet Commonly known as:  Oxy IR/ROXICODONE Take 1-2 tablets (5-10 mg total) by mouth every 4 (four) hours as needed for moderate pain (pain score 4-6).   polyethylene glycol packet Commonly known as:  MIRALAX / GLYCOLAX Take 17 g by mouth 2 (two) times daily.   predniSONE 5 MG tablet Commonly known as:  DELTASONE Take 5 mg by mouth daily with breakfast.   VOLTAREN 1 % Gel Generic drug:  diclofenac sodium Apply 1 application topically 2 (two) times daily as needed for pain.   zolpidem 5 MG tablet Commonly known as:  AMBIEN Take 5 mg by mouth at bedtime as needed.            Discharge Care Instructions  (From admission, onward)         Start     Ordered   09/06/18 0000  Change dressing    Comments:  DO NOT REMOVE BANDAGE OVER SURGICAL INCISION.  WASH WHOLE LEG INCLUDING OVER THE WATERPROOF BANDAGE WITH SOAP AND WATER EVERY DAY.   09/06/18 0836          Diagnostic Studies: No results found.  Disposition: Discharge disposition: 01-Home or Self Care       Discharge Instructions    CPM   Complete by:  As directed    Continuous passive motion machine (CPM):      Use the CPM from 0 to 90 for 6 hours per day.       You may break it up into 2 or 3 sessions per day.      Use CPM for 2 weeks or until you are told to stop.   Call MD / Call 911   Complete by:  As directed    If you experience chest pain or shortness of breath, CALL 911 and be transported to the hospital emergency room.  If  you develope a fever above 101 F, pus (white drainage) or increased drainage or redness at the wound, or calf pain, call your surgeon's office.   Change dressing   Complete by:  As directed    DO NOT REMOVE BANDAGE OVER SURGICAL INCISION.  WASH WHOLE LEG INCLUDING OVER THE WATERPROOF BANDAGE WITH SOAP AND WATER EVERY DAY.   Constipation Prevention   Complete by:  As directed    Drink plenty of fluids.  Prune juice may be helpful.  You may use a stool softener, such as Colace (over the counter) 100 mg twice a day.  Use MiraLax (over the counter) for constipation as needed.   Diet - low sodium heart healthy   Complete by:  As directed    Discharge instructions   Complete by:  As directed    INSTRUCTIONS AFTER JOINT REPLACEMENT   Remove items at home which could result in a fall. This includes throw rugs or furniture in walking pathways ICE to the affected joint every three hours while awake for 30 minutes at a time, for at least the first 3-5 days, and then as needed for pain and swelling.  Continue to use ice for pain and swelling. You may notice swelling that will progress down to the foot and ankle.  This is normal after surgery.  Elevate your leg when you are not up walking on it.   Continue to use the breathing machine you got in the hospital (incentive spirometer) which will help keep your temperature down.  It is common for your temperature to cycle up and down following surgery, especially at night when you are not up moving around and exerting yourself.  The breathing machine keeps your lungs expanded and your temperature down.   DIET:  As you were doing prior to hospitalization, we recommend a well-balanced diet.  DRESSING / WOUND CARE / SHOWERING  Keep the surgical dressing until follow up.  The dressing is water proof, so you can shower without any extra covering.  IF THE DRESSING FALLS OFF or the wound gets wet inside, change the dressing with sterile gauze.  Please use good hand  washing techniques before changing the dressing.  Do not use any lotions or creams on the incision until instructed by your surgeon.    ACTIVITY  Increase activity slowly as tolerated, but follow the weight bearing instructions below.   No driving for 6 weeks or until further direction given by your physician.  You cannot drive while taking narcotics.  No lifting or carrying greater than 10 lbs. until further directed by your surgeon. Avoid periods of inactivity such as sitting longer than an hour when not asleep. This helps prevent blood clots.  You may return to work once you are authorized by your doctor.     WEIGHT BEARING   Weight bearing as tolerated with assist device (walker, cane, etc) as directed, use it as long as suggested by your surgeon or therapist, typically at least 2-3 weeks.   EXERCISES  Results after joint replacement surgery are often greatly improved when you follow the exercise, range of motion and muscle strengthening exercises prescribed by your doctor. Safety measures are also important to protect the joint from further injury. Any time any of these exercises cause you to have increased pain or swelling, decrease what you are doing until you are comfortable again and then slowly increase them. If you have problems or questions, call your caregiver or physical therapist for advice.   Rehabilitation is important following a joint replacement. After just a few days of immobilization, the muscles of the leg can become weakened and shrink (atrophy).  These exercises are designed to build up the tone and strength of the thigh and leg muscles and to improve motion. Often times heat used for twenty to thirty minutes before working out will loosen up your tissues and help with improving the range of motion but do not use heat for the first two  weeks following surgery (sometimes heat can increase post-operative swelling).   These exercises can be done on a training (exercise)  mat, on the floor, on a table or on a bed. Use whatever works the best and is most comfortable for you.    Use music or television while you are exercising so that the exercises are a pleasant break in your day. This will make your life better with the exercises acting as a break in your routine that you can look forward to.   Perform all exercises about fifteen times, three times per day or as directed.  You should exercise both the operative leg and the other leg as well.   Exercises include:  Quad Sets - Tighten up the muscle on the front of the thigh (Quad) and hold for 5-10 seconds.   Straight Leg Raises - With your knee straight (if you were given a brace, keep it on), lift the leg to 60 degrees, hold for 3 seconds, and slowly lower the leg.  Perform this exercise against resistance later as your leg gets stronger.  Leg Slides: Lying on your back, slowly slide your foot toward your buttocks, bending your knee up off the floor (only go as far as is comfortable). Then slowly slide your foot back down until your leg is flat on the floor again.  Angel Wings: Lying on your back spread your legs to the side as far apart as you can without causing discomfort.  Hamstring Strength:  Lying on your back, push your heel against the floor with your leg straight by tightening up the muscles of your buttocks.  Repeat, but this time bend your knee to a comfortable angle, and push your heel against the floor.  You may put a pillow under the heel to make it more comfortable if necessary.   A rehabilitation program following joint replacement surgery can speed recovery and prevent re-injury in the future due to weakened muscles. Contact your doctor or a physical therapist for more information on knee rehabilitation.    CONSTIPATION  Constipation is defined medically as fewer than three stools per week and severe constipation as less than one stool per week.  Even if you have a regular bowel pattern at home, your  normal regimen is likely to be disrupted due to multiple reasons following surgery.  Combination of anesthesia, postoperative narcotics, change in appetite and fluid intake all can affect your bowels.   YOU MUST use at least one of the following options; they are listed in order of increasing strength to get the job done.  They are all available over the counter, and you may need to use some, POSSIBLY even all of these options:    Drink plenty of fluids (prune juice may be helpful) and high fiber foods Colace 100 mg by mouth twice a day  Senokot for constipation as directed and as needed Dulcolax (bisacodyl), take with full glass of water  Miralax (polyethylene glycol) once or twice a day as needed.  If you have tried all these things and are unable to have a bowel movement in the first 3-4 days after surgery call either your surgeon or your primary doctor.    If you experience loose stools or diarrhea, hold the medications until you stool forms back up.  If your symptoms do not get better within 1 week or if they get worse, check with your doctor.  If you experience "the worst abdominal pain ever" or develop nausea or vomiting, please  contact the office immediately for further recommendations for treatment.   ITCHING:  If you experience itching with your medications, try taking only a single pain pill, or even half a pain pill at a time.  You can also use Benadryl over the counter for itching or also to help with sleep.   TED HOSE STOCKINGS:  Use stockings on both legs until for at least 2 weeks or as directed by physician office. They may be removed at night for sleeping.  MEDICATIONS:  See your medication summary on the "After Visit Summary" that nursing will review with you.  You may have some home medications which will be placed on hold until you complete the course of blood thinner medication.  It is important for you to complete the blood thinner medication as prescribed.  PRECAUTIONS:   If you experience chest pain or shortness of breath - call 911 immediately for transfer to the hospital emergency department.   If you develop a fever greater that 101 F, purulent drainage from wound, increased redness or drainage from wound, foul odor from the wound/dressing, or calf pain - CONTACT YOUR SURGEON.                                                   FOLLOW-UP APPOINTMENTS:  If you do not already have a post-op appointment, please call the office for an appointment to be seen by your surgeon.  Guidelines for how soon to be seen are listed in your "After Visit Summary", but are typically between 1-4 weeks after surgery.  OTHER INSTRUCTIONS:   Knee Replacement:  Do not place pillow under knee, focus on keeping the knee straight while resting. CPM instructions: 0-90 degrees, 2 hours in the morning, 2 hours in the afternoon, and 2 hours in the evening. Place foam block, curve side up under heel at all times except when in CPM or when walking.  DO NOT modify, tear, cut, or change the foam block in any way.  MAKE SURE YOU:  Understand these instructions.  Get help right away if you are not doing well or get worse.    Thank you for letting us be a part of your medical care team.  It is a privilege we respect greatly.  We hope these instructions will help you stay on track for a fast and full recovery!   Do not put a pillow under the knee. Place it under the heel.   Complete by:  As directed    Place gray foam block, curve side up under heel at all times except when in CPM or when walking.  DO NOT modify, tear, cut, or change in any way the gray foam block.   Increase activity slowly as tolerated   Complete by:  As directed    Patient may shower   Complete by:  As directed    Aquacel dressing is water proof    Wash over it and the whole leg with soap and water at the end of your shower   TED hose   Complete by:  As directed    Use stockings (TED hose) for 2 weeks on both leg(s).  You  may remove them at night for sleeping.      Follow-up Information    Salvatore Marvel, MD. Go on 09/18/2018.   Specialty:  Orthopedic Surgery  Why:  Your appointment has been made for 2:00 pm  Contact information: [address] [place] [address] [phone]        [facility]. Go on 09/18/2018.   Why:  You are scheduled to start OPPT directly after your appointment with Dr Thurston Hole. You scheduled time is 3:00. Please go over after your MD appointment to complete your paperwork  Contact information: [address] [place], [place] [address]  [phone]       [facility] Follow up.   Specialty:  Home Health Services Why:  [facility] will see you for 5 visits prior to starting OPPT  Contact information: [address] [place] [address] [place] [address] [phone]            Signed: Pascal Lux 09/06/2018, 8:52 AM

## 2018-09-06 NOTE — Progress Notes (Signed)
Discharge instructions (including medications) discussed with and copy provided to patient/caregiver. All belongings sent with patient. 

## 2019-11-08 ENCOUNTER — Encounter: Payer: Self-pay | Admitting: Internal Medicine

## 2019-11-16 ENCOUNTER — Ambulatory Visit: Payer: Medicare Other | Attending: Internal Medicine

## 2019-11-16 DIAGNOSIS — Z23 Encounter for immunization: Secondary | ICD-10-CM | POA: Insufficient documentation

## 2019-11-16 NOTE — Progress Notes (Signed)
   Covid-19 Vaccination Clinic  Name:  SHAVONE NEVERS    MRN: [id number] DOB: [birth year]  11/16/2019  Ms. Guyett was observed post Covid-19 immunization for 15 minutes without incidence. She was provided with Vaccine Information Sheet and instruction to access the V-Safe system.   Ms. Forney was instructed to call 911 with any severe reactions post vaccine: Marland Kitchen Difficulty breathing  . Swelling of your face and throat  . A fast heartbeat  . A bad rash all over your body  . Dizziness and weakness    Immunizations Administered    Name Date Dose VIS Date Route   Pfizer COVID-19 Vaccine 11/16/2019  3:58 PM 0.3 mL 10/05/2019 Intramuscular   Manufacturer: [employer], [employer]   Lot: [id number]   NDC: [address]-9

## 2019-11-21 ENCOUNTER — Ambulatory Visit (INDEPENDENT_AMBULATORY_CARE_PROVIDER_SITE_OTHER): Payer: Medicare Other | Admitting: Internal Medicine

## 2019-11-21 ENCOUNTER — Encounter: Payer: Self-pay | Admitting: Internal Medicine

## 2019-11-21 ENCOUNTER — Other Ambulatory Visit: Payer: Self-pay

## 2019-11-21 VITALS — BP 100/62 | HR 68 | Temp 97.6°F | Ht 68.0 in | Wt 191.0 lb

## 2019-11-21 DIAGNOSIS — R1310 Dysphagia, unspecified: Secondary | ICD-10-CM

## 2019-11-21 DIAGNOSIS — K219 Gastro-esophageal reflux disease without esophagitis: Secondary | ICD-10-CM | POA: Diagnosis not present

## 2019-11-21 DIAGNOSIS — Z01818 Encounter for other preprocedural examination: Secondary | ICD-10-CM | POA: Diagnosis not present

## 2019-11-21 DIAGNOSIS — R1319 Other dysphagia: Secondary | ICD-10-CM

## 2019-11-21 NOTE — Patient Instructions (Signed)
  You have been scheduled for an endoscopy. Please follow written instructions given to you at your visit today. If you use inhalers (even only as needed), please bring them with you on the day of your procedure.   I appreciate the opportunity to care for you. Carl Gessner, MD, FACG 

## 2019-11-21 NOTE — Progress Notes (Signed)
Jackie Cooke 68 y.o. 30-Sep-1951 [id number]  Assessment & Plan:   Encounter Diagnoses  Name Primary?  . Esophageal dysphagia Yes  . Gastroesophageal reflux disease, unspecified whether esophagitis present   . Encounter for other preprocedural examination     Agree with increase in PPI per PCP  Evaluate with EGD and anticipate esophageal dilation The risks and benefits as well as alternatives of endoscopic procedure(s) have been discussed and reviewed. All questions answered. The patient agrees to proceed.  Colonoscopy or other appropriate screening test 2022 since last 2012 and negative  I appreciate the opportunity to care for this patient. CC: Jackie Hansen, NP   Subjective:   Chief Complaint: dysphagia  HPI 70 year old white woman here because of dysphagia, referred by [name], NP. For a few months 2-3 x a week impact dysphagia w/ solids. Suprastenal stick - waits a bit it relaxes and can continue eating.  "GERD all my life" Heartburn and regurgitation if did not take something -   Had been on lansoprazole OTC and then went on lansoprazole 30 mg daily per [name].  She has some 2005 records from previous GI evaluations in [place] and prior to that date as well.  I looked at them in the office today.  The notes from 2005 show that his previous EGD in 2001 did not show Barrett's or esophagitis and she was diagnosed with functional dyspepsia.  There was a history of H. pylori testing positive and treated and eradication was documented.  She was on twice daily Nexium back then.  No dysphagia issues or anything like this really then though I think some of the GERD symptoms she refers to is what she was describing then.  She reports a 2012 colonoscopy for screening being negative and she had had one in 2001 that was negative as well.    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  He retired at 26 and 6 months later was diagnosed with Burkitt's lymphoma and unfortunately died after a bone marrow transplant.  After 40 some years in [place] her daughter wanted to move up here so she moved up to [place] along with her daughter who is a Engineer, civil (consulting) that works for the [employer].  She is enjoying living in [place] the past several years.  She does have a "significant other"  Caffeine use about 1 a day not a smoker less than 1 alcoholic beverage a day.  Wt Readings from Last 3 Encounters:  11/21/19 191 lb (86.6 kg)  08/23/18 186 lb 9.6 oz (84.6 kg)  08/23/18 184 lb 3.2 oz (83.6 kg)   She does say in the past she has had some IBS.  GI review of systems is otherwise negative.  I reviewed [facility] note from January, also had labs January 13 and 14 with a negative H. pylori stool antigen, normal CMET except for mild hyperglycemia normal CBC September 2020  Also had a CT of the abdomen and pelvis in 2020 in February with some prominent fluid-filled loops of small bowel otherwise negative Allergies  Allergen Reactions  . Morphine And Related Anaphylaxis and Shortness Of Breath  . Celebrex [Celecoxib] Hives   Current Meds  Medication Sig  . acetaminophen (TYLENOL) 650 MG CR tablet Take 1,300 mg by mouth 2 (two) times daily.  . cholecalciferol (VITAMIN D) 1000 units tablet Take 1,000 Units by  mouth daily.  Marland Kitchen escitalopram (LEXAPRO) 10 MG tablet Take 10 mg by mouth.  . estradiol (ESTRACE) 1 MG tablet Take 1 tablet by mouth daily.  . fesoterodine (TOVIAZ) 8 MG TB24 tablet Take 1 tablet by mouth daily.  Marland Kitchen gabapentin (NEURONTIN) 300 MG capsule Take 1 capsule (300 mg total) by mouth at bedtime. (Patient taking differently: Take 300 mg by mouth every other day. )  . lansoprazole (PREVACID) 30 MG capsule Take 1 capsule by mouth daily.  Marland Kitchen levothyroxine (SYNTHROID, LEVOTHROID) 137 MCG tablet Take 137 mcg by mouth daily before breakfast.   .  losartan-hydrochlorothiazide (HYZAAR) 50-12.5 MG tablet Take 1 tablet by mouth daily.  Marland Kitchen NIFEdipine (PROCARDIA-XL/ADALAT-CC/NIFEDICAL-XL) 30 MG 24 hr tablet Take 30 mg by mouth daily.   . predniSONE (DELTASONE) 5 MG tablet Take 5 mg by mouth daily with breakfast.  . VOLTAREN 1 % GEL Apply 1 application topically 2 (two) times daily as needed for pain.  Marland Kitchen zolpidem (AMBIEN) 5 MG tablet Take 5 mg by mouth at bedtime as needed.   Past Medical History:  Diagnosis Date  . Arthritis   . Baker's cyst of knee, left 03/07/2018  . Essential hypertension, benign 02/20/2018  . Functional dyspepsia   . GERD (gastroesophageal reflux disease)    at times takes over the counter meds   . H. pylori infection   . Hypertension   . Hypothyroidism 02/20/2018  . Painful retained orthopaedic hardware right knee(HCC) 07/15/2018  . PONV (postoperative nausea and vomiting)   . Primary localized osteoarthritis of left knee 02/20/2018  . Primary localized osteoarthritis of right knee 07/15/2018  . S/P total knee replacement, left 07/15/2018  . Thyroid disease    Past Surgical History:  Procedure Laterality Date  . ABDOMINAL HYSTERECTOMY    . BLADDER REPAIR    . CHOLECYSTECTOMY    . COLONOSCOPY    . ESOPHAGOGASTRODUODENOSCOPY    . HARDWARE REMOVAL Right 09/04/2018   Procedure: HARDWARE REMOVAL;  Surgeon: Salvatore Marvel, MD;  Location: [facility] OR;  Service: Orthopedics;  Laterality: Right;  . KNEE SURGERY Right   . removed ovary  Bilateral   . TOTAL HIP ARTHROPLASTY Right 1988  . TOTAL KNEE ARTHROPLASTY Left 03/06/2018   Procedure: TOTAL KNEE ARTHROPLASTY;  Surgeon: Salvatore Marvel, MD;  Location: [facility] OR;  Service: Orthopedics;  Laterality: Left;  . TOTAL KNEE ARTHROPLASTY Right 09/04/2018   Procedure: TOTAL KNEE ARTHROPLASTY;  Surgeon: Salvatore Marvel, MD;  Location: [facility] OR;  Service: Orthopedics;  Laterality: Right;  . TOTAL THYROIDECTOMY  2007   Social History   Social History Narrative   Widowed 1 daughter, has a  significant other Conley Rolls [phone]   Daughter is RN    Anastasio Auerbach  [phone]   Former smoker, occasional alcohol no other tobacco no drug use 1 caffeinated beverage daily   family history includes Diabetes Mellitus II in her brother; Hypertension in her mother; Pulmonary fibrosis in her father; Thyroid cancer in her sister.   Review of Systems As per HPI some anxiety issues at times.  All other review of systems appear negative.  Objective:   Physical Exam @BP  100/62   Pulse 68   Temp 97.6 F (36.4 C)   Ht 5\' 8"  (1.727 m)   Wt 191 lb (86.6 kg)   BMI 29.04 kg/m @  General:  Well-developed, well-nourished and in no acute distress Eyes:  anicteric. Neck:   supple w/o thyromegaly or mass.  Lungs: Clear to auscultation bilaterally. Heart:   S1S2, no  rubs, murmurs, gallops. Abdomen:  soft, non-tender, no hepatosplenomegaly, hernia, or mass and BS+.   Extremities:   no cyanosis or clubbing Neuro:  A&O x 3.  Psych:  appropriate mood and  Affect.   Data Reviewed: See HPI

## 2019-11-28 ENCOUNTER — Ambulatory Visit (INDEPENDENT_AMBULATORY_CARE_PROVIDER_SITE_OTHER): Payer: Medicare Other

## 2019-11-28 ENCOUNTER — Other Ambulatory Visit: Payer: Self-pay | Admitting: Internal Medicine

## 2019-11-28 DIAGNOSIS — Z1159 Encounter for screening for other viral diseases: Secondary | ICD-10-CM

## 2019-11-29 LAB — SARS CORONAVIRUS 2 (TAT 6-24 HRS): SARS Coronavirus 2: NEGATIVE

## 2019-11-30 ENCOUNTER — Other Ambulatory Visit: Payer: Self-pay

## 2019-11-30 ENCOUNTER — Ambulatory Visit (AMBULATORY_SURGERY_CENTER): Payer: Medicare Other | Admitting: Internal Medicine

## 2019-11-30 ENCOUNTER — Encounter: Payer: Self-pay | Admitting: Internal Medicine

## 2019-11-30 VITALS — BP 110/57 | HR 56 | Temp 95.7°F | Resp 16 | Ht 68.0 in | Wt 191.0 lb

## 2019-11-30 DIAGNOSIS — R1319 Other dysphagia: Secondary | ICD-10-CM

## 2019-11-30 DIAGNOSIS — K449 Diaphragmatic hernia without obstruction or gangrene: Secondary | ICD-10-CM | POA: Diagnosis not present

## 2019-11-30 DIAGNOSIS — K2210 Ulcer of esophagus without bleeding: Secondary | ICD-10-CM | POA: Diagnosis not present

## 2019-11-30 DIAGNOSIS — K219 Gastro-esophageal reflux disease without esophagitis: Secondary | ICD-10-CM | POA: Diagnosis not present

## 2019-11-30 DIAGNOSIS — R1310 Dysphagia, unspecified: Secondary | ICD-10-CM | POA: Diagnosis present

## 2019-11-30 DIAGNOSIS — K228 Other specified diseases of esophagus: Secondary | ICD-10-CM

## 2019-11-30 MED ORDER — SODIUM CHLORIDE 0.9 % IV SOLN: 500.0000 mL | Freq: Once | INTRAVENOUS | Status: DC

## 2019-11-30 NOTE — Progress Notes (Signed)
Vitals-CW Temp-JB  Pt's states no medical or surgical changes since previsit or office visit. 

## 2019-11-30 NOTE — Progress Notes (Signed)
A/ox3, pleased with MAC, report to RN 

## 2019-11-30 NOTE — Patient Instructions (Addendum)
There was one erosion (tiny linear ulceration) in the esophagus and the esophagus was somewhat tortuous (suggests altered motility). Erosion biopsied. No stricture but I did dilate to see if that helps.  Also a hiatal hernia.  As you know, you clearly have GERD.  Stay on the higher dose of lansoprazole - I will call or send My Chart messager re: pathology results and set up follow-up.  Follow an antireflux regimen.  This includes:      - Do not lie down for at least 3 to 4 hours after meals.       - Raise the head of the bed 4 to 6 inches.       - Decrease excess weight.       - Avoid citrus juices and other acidic foods, alcohol, chocolate, mints, coffee and other caffeinated beverages, carbonated beverages, fatty and fried foods.       - Avoid tight-fitting clothing.       - Avoid cigarettes and other tobacco products.   I appreciate the opportunity to care for you. Gatha Mayer, MD, [facility]   Dilation diet - clear liquids until 10:20am today.  Soft foods the rest of today.  Resume regular diet tomorrow.  Information on hiatal hernia given to you today.  YOU HAD AN ENDOSCOPIC PROCEDURE TODAY AT [facility]:   Refer to the procedure report that was given to you for any specific questions about what was found during the examination.  If the procedure report does not answer your questions, please call your gastroenterologist to clarify.  If you requested that your care partner not be given the details of your procedure findings, then the procedure report has been included in a sealed envelope for you to review at your convenience later.  YOU SHOULD EXPECT: Some feelings of bloating in the abdomen. Passage of more gas than usual.  Walking can help get rid of the air that was put into your GI tract during the procedure and reduce the bloating. If you had a lower endoscopy (such as a colonoscopy or flexible sigmoidoscopy) you may notice spotting of blood in your stool or on  the toilet paper. If you underwent a bowel prep for your procedure, you may not have a normal bowel movement for a few days.  Please Note:  You might notice some irritation and congestion in your nose or some drainage.  This is from the oxygen used during your procedure.  There is no need for concern and it should clear up in a day or so.  SYMPTOMS TO REPORT IMMEDIATELY:    Following upper endoscopy (EGD)  Vomiting of blood or coffee ground material  New chest pain or pain under the shoulder blades  Painful or persistently difficult swallowing  New shortness of breath  Fever of 100F or higher  Black, tarry-looking stools  For urgent or emergent issues, a gastroenterologist can be reached at any hour by calling [phone].   DIET:  We do recommend a small meal at first, but then you may proceed to your regular diet.  Drink plenty of fluids but you should avoid alcoholic beverages for 24 hours.  ACTIVITY:  You should plan to take it easy for the rest of today and you should NOT DRIVE or use heavy machinery until tomorrow (because of the sedation medicines used during the test).    FOLLOW UP: Our staff will call the number listed on your records 48-72 hours following your procedure to  check on you and address any questions or concerns that you may have regarding the information given to you following your procedure. If we do not reach you, we will leave a message.  We will attempt to reach you two times.  During this call, we will ask if you have developed any symptoms of COVID 19. If you develop any symptoms (ie: fever, flu-like symptoms, shortness of breath, cough etc.) before then, please call [phone].  If you test positive for Covid 19 in the 2 weeks post procedure, please call and report this information to [place].    If any biopsies were taken you will be contacted by phone or by letter within the next 1-3 weeks.  Please call us at [phone] if you have not heard about the  biopsies in 3 weeks.    SIGNATURES/CONFIDENTIALITY: You and/or your care partner have signed paperwork which will be entered into your electronic medical record.  These signatures attest to the fact that that the information above on your After Visit Summary has been reviewed and is understood.  Full responsibility of the confidentiality of this discharge information lies with you and/or your care-partner.

## 2019-11-30 NOTE — Op Note (Addendum)
[facility] Patient Name: Jackie Cooke Procedure Date: 11/30/2019 8:44 AM MRN: [id number] Endoscopist: Iva Boop , MD Age: 69 Referring MD:  Date of Birth: 1951/08/29 Gender: Female Account #: [id number] Procedure:                Upper GI endoscopy Indications:              Dysphagia, Heartburn Medicines:                Propofol per Anesthesia, Monitored Anesthesia Care Procedure:                Pre-Anesthesia Assessment:                           - Prior to the procedure, a History and Physical                            was performed, and patient medications and                            allergies were reviewed. The patient's tolerance of                            previous anesthesia was also reviewed. The risks                            and benefits of the procedure and the sedation                            options and risks were discussed with the patient.                            All questions were answered, and informed consent                            was obtained. Prior Anticoagulants: The patient has                            taken no previous anticoagulant or antiplatelet                            agents. ASA Grade Assessment: II - A patient with                            mild systemic disease. After reviewing the risks                            and benefits, the patient was deemed in                            satisfactory condition to undergo the procedure.                           After obtaining informed consent, the endoscope was  passed under direct vision. Throughout the                            procedure, the patient's blood pressure, pulse, and                            oxygen saturations were monitored continuously. The                            Endoscope was introduced through the mouth, and                            advanced to the second part of duodenum. The upper                            GI  endoscopy was accomplished without difficulty.                            The patient tolerated the procedure well. Scope In: Scope Out: Findings:                 LA Grade A (one or more mucosal breaks less than 5                            mm, not extending between tops of 2 mucosal folds)                            esophagitis with no bleeding was found in the                            distal esophagus. Biopsies were taken with a cold                            forceps for histology. Verification of patient                            identification for the specimen was done. Estimated                            blood loss was minimal.                           The examined esophagus was moderately tortuous.                           A small, sliding hiatal hernia was present.                           The exam was otherwise without abnormality.                           The cardia and gastric fundus were normal on  retroflexion.                           A TTS dilator was passed through the scope.                            Dilation with an 18-19-20 mm balloon dilator was                            performed to 20 mm at the gastroesophageal                            junction. Estimated blood loss: none. Complications:            No immediate complications. Estimated Blood Loss:     Estimated blood loss was minimal. Impression:               - LA Grade A reflux esophagitis with no bleeding.                            Biopsied.                           - Tortuous esophagus. Suggests dysmotility.                           - Small hiatal hernia.                           - The examination was otherwise normal. GE junction                            dilated in case of occult stricture Recommendation:           - Patient has a contact number available for                            emergencies. The signs and symptoms of potential                             delayed complications were discussed with the                            patient. Return to normal activities tomorrow.                            Written discharge instructions were provided to the                            patient.                           - Clear liquids x 1 hour then soft foods rest of                            day. Start prior diet tomorrow.                           -  Follow an antireflux regimen.                           - Stay on 30 mg lansoprazole - has been on that 3                            weeks or so so too soon to change medication                            dose/freequency                           - If dysphagia persists will consider motility                            evaluation Iva Boop, MD 11/30/2019 9:27:11 AM This report has been signed electronically.

## 2019-12-04 ENCOUNTER — Telehealth: Payer: Self-pay | Admitting: *Deleted

## 2019-12-04 NOTE — Telephone Encounter (Signed)
  Follow up Call-  Call back number 11/30/2019  Post procedure Call Back phone  # [phone]  Permission to leave phone message Yes  Some recent data might be hidden     Patient questions:  Do you have a fever, pain , or abdominal swelling? No. Pain Score  0 *  Have you tolerated food without any problems? Yes.    Have you been able to return to your normal activities? Yes.    Do you have any questions about your discharge instructions: Diet   No. Medications  No. Follow up visit  No.  Do you have questions or concerns about your Care? No.  Actions: * If pain score is 4 or above: No action needed, pain <4.  1. Have you developed a fever since your procedure? no  2.   Have you had an respiratory symptoms (SOB or cough) since your procedure? no  3.   Have you tested positive for COVID 19 since your procedure no  4.   Have you had any family members/close contacts diagnosed with the COVID 19 since your procedure?  no   If yes to any of these questions please route to Laverna Peace, RN and Jennye Boroughs, Charity fundraiser.

## 2019-12-07 ENCOUNTER — Ambulatory Visit: Payer: Medicare Other | Attending: Internal Medicine

## 2019-12-07 ENCOUNTER — Encounter: Payer: Self-pay | Admitting: Internal Medicine

## 2019-12-07 DIAGNOSIS — Z23 Encounter for immunization: Secondary | ICD-10-CM | POA: Insufficient documentation

## 2019-12-07 NOTE — Progress Notes (Signed)
   Covid-19 Vaccination Clinic  Name:  Jackie Cooke    MRN: [id number] DOB: 1951-07-25  12/07/2019  [name] was observed post Covid-19 immunization for 15 minutes without incidence. She was provided with Vaccine Information Sheet and instruction to access the V-Safe system.   [name] was instructed to call 911 with any severe reactions post vaccine: Marland Kitchen Difficulty breathing  . Swelling of your face and throat  . A fast heartbeat  . A bad rash all over your body  . Dizziness and weakness    Immunizations Administered    Name Date Dose VIS Date Route   Pfizer COVID-19 Vaccine 12/07/2019  3:25 PM 0.3 mL 10/05/2019 Intramuscular   Manufacturer: [employer], [employer]   Lot: [id number]   NDC: [address]-8

## 2021-06-30 ENCOUNTER — Encounter: Payer: Self-pay | Admitting: Internal Medicine

## 2021-08-13 LAB — COLOGUARD: COLOGUARD: NEGATIVE

## 2024-01-16 ENCOUNTER — Encounter: Payer: Self-pay | Admitting: Gastroenterology

## 2024-03-14 ENCOUNTER — Encounter: Payer: Self-pay | Admitting: Neurology

## 2024-03-21 ENCOUNTER — Ambulatory Visit: Admitting: Gastroenterology

## 2024-03-27 ENCOUNTER — Ambulatory Visit (INDEPENDENT_AMBULATORY_CARE_PROVIDER_SITE_OTHER): Admitting: Neurology

## 2024-03-27 ENCOUNTER — Encounter: Payer: Self-pay | Admitting: Neurology

## 2024-03-27 VITALS — BP 123/69 | HR 64 | Ht 70.0 in | Wt 178.0 lb

## 2024-03-27 DIAGNOSIS — R292 Abnormal reflex: Secondary | ICD-10-CM | POA: Diagnosis not present

## 2024-03-27 DIAGNOSIS — M79605 Pain in left leg: Secondary | ICD-10-CM | POA: Diagnosis not present

## 2024-03-27 DIAGNOSIS — M79604 Pain in right leg: Secondary | ICD-10-CM | POA: Diagnosis not present

## 2024-03-27 NOTE — Progress Notes (Signed)
 [facility] Clinic Note - Initial Visit   Date: 03/27/2024   Jackie Cooke: [id number] DOB: [birth year]   Dear Dr. Pryor Browning:  Thank you for your kind referral of Jackie Cooke for consultation of neuropathy. Although her history is well known to you, please allow us  to reiterate it for the purpose of our medical record. The patient was accompanied to the clinic by self.    Jackie Cooke is a 73 y.o. right-handed female with GERD, hypothyroidism, hypertension, depression, and chronic low back pain presenting for evaluation of neuropathy.   IMPRESSION/PLAN: Bilateral feet pain and paresthesias with prior testing suggestive of neuropathy.  I have personally reviewed NCS which shows mildly reduced motor and sensory amplitudes with slowed conduction velocity.  I do not have the waveforms to review.  Her exam, however, show intact distal sensation, strength, and reflexes.  I discussed that because her exam and nerve testing is not consistent with her symptomology, I would like to repeat nerve testing.  Given that her symptoms transiently, but completely improved with ESI, I would like to take a look at her lumbar spine to see if there is any areas of impingement.  In any case, I explained that even if she did have neuropathy, her burning pain behind the left knee would not be explained by this. I am not sure what is causing her localized pain there.   - NCS/EMG bilateral legs  - MRI lumbar spine wo contrast  Further recommendations pending results.   ------------------------------------------------------------- History of present illness: For the past two years, she has chronic bilateral feet pain and burning sensation behind the left knee.  She also has sharp pain involving the heel and arches of the pain.  There is no specific trigger, such as activity or rest. She also has chronic low back pain and completed physical therapy, with no significant  benefit.  She reports having ESI last week and had resolution of her feet and leg pain for about a day, and then it returned.   She takes gabapentin  300mg  three times daily.  She takes NSAIDs daily and applies volteran ointment which gives her most relief.  In the past, she was taking low does prednisone  which also controlled her pain, but due to absence of primary autoimmune condition, this was stopped.    She moved from [place], [place]  in 2017 after her husband passed away.    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  No evidence of lumbar radiculopathy, plexopathy, or myopathy.     Past Medical History:  Diagnosis Date   Arthritis    Baker's cyst of knee, left 03/07/2018   Essential hypertension, benign 02/20/2018   Functional dyspepsia    GERD (gastroesophageal reflux disease)    at times takes over the counter meds    H. pylori infection    Hypertension    Hypothyroidism 02/20/2018   Painful retained orthopaedic hardware right knee(HCC) 07/15/2018   PONV (postoperative nausea and vomiting)    Primary localized osteoarthritis of left knee 02/20/2018   Primary localized osteoarthritis of right knee 07/15/2018   S/P total knee replacement, left 07/15/2018   Thyroid disease     Past Surgical History:  Procedure Laterality Date   ABDOMINAL HYSTERECTOMY     BLADDER REPAIR     CHOLECYSTECTOMY  COLONOSCOPY     ESOPHAGOGASTRODUODENOSCOPY     HARDWARE REMOVAL Right 09/04/2018   Procedure: HARDWARE REMOVAL;  Surgeon: Elly Habermann, MD;  Location: [facility] OR;  Service: Orthopedics;  Laterality: Right;   KNEE SURGERY Right    removed ovary  Bilateral    TOTAL HIP ARTHROPLASTY Right 1988   TOTAL KNEE ARTHROPLASTY Left 03/06/2018   Procedure: TOTAL KNEE ARTHROPLASTY;  Surgeon: Elly Habermann, MD;  Location: [facility]  OR;  Service: Orthopedics;  Laterality: Left;   TOTAL KNEE ARTHROPLASTY Right 09/04/2018   Procedure: TOTAL KNEE ARTHROPLASTY;  Surgeon: Elly Habermann, MD;  Location: [facility] OR;  Service: Orthopedics;  Laterality: Right;   TOTAL THYROIDECTOMY  2007     Medications:  Outpatient Encounter Medications as of 03/27/2024  Medication Sig   acetaminophen  (TYLENOL ) 650 MG CR tablet Take 1,300 mg by mouth 2 (two) times daily.   cholecalciferol  (VITAMIN D ) 1000 units tablet Take 1,000 Units by mouth daily.   escitalopram  (LEXAPRO ) 10 MG tablet Take 10 mg by mouth.   estradiol (ESTRACE) 1 MG tablet Take 1 tablet by mouth daily.   fesoterodine (TOVIAZ) 8 MG TB24 tablet Take 1 tablet by mouth daily.   gabapentin  (NEURONTIN ) 300 MG capsule Take 1 capsule (300 mg total) by mouth at bedtime. (Patient taking differently: Take 300 mg by mouth 3 (three) times daily.)   lansoprazole (PREVACID) 30 MG capsule Take 1 capsule by mouth daily.   levothyroxine  (SYNTHROID , LEVOTHROID) 137 MCG tablet Take 137 mcg by mouth daily before breakfast.    losartan-hydrochlorothiazide (HYZAAR) 50-12.5 MG tablet Take 1 tablet by mouth daily.   NIFEdipine  (PROCARDIA -XL/ADALAT -CC/NIFEDICAL-XL) 30 MG 24 hr tablet Take 30 mg by mouth daily.    VOLTAREN 1 % GEL Apply 1 application topically 2 (two) times daily as needed for pain.   zolpidem  (AMBIEN ) 5 MG tablet Take 5 mg by mouth at bedtime as needed.   predniSONE  (DELTASONE ) 5 MG tablet Take 5 mg by mouth daily with breakfast.   No facility-administered encounter medications on file as of 03/27/2024.    Allergies:  Allergies  Allergen Reactions   Morphine And Codeine Anaphylaxis and Shortness Of Breath    Patient states she does not have an allergy to Codeine    Celebrex [Celecoxib] Hives    Family History: Family History  Problem Relation Age of Onset   Hypertension Mother    Other Mother        Massive Brain Bleed   Pulmonary fibrosis Father    Thyroid cancer Sister     Diabetes Mellitus II Brother    Colon cancer Maternal Uncle    Esophageal cancer Neg Hx    Liver cancer Neg Hx    Rectal cancer Neg Hx    Stomach cancer Neg Hx     Social History: Social History   Tobacco Use   Smoking status: Former    Current packs/day: 0.50    Average packs/day: 0.5 packs/day for 10.0 years (5.0 ttl pk-yrs)    Types: Cigarettes   Smokeless tobacco: Never   Tobacco comments:    quit 27 yrs. ago  Vaping Use   Vaping status: Never Used  Substance Use Topics   Alcohol use: Yes    Comment: occasional    Drug use: No   Social History   Social History Narrative   Widowed 1 daughter, has a significant other Jackie Cooke [phone]   Daughter is RN    Jackie Cooke  [phone]   Former smoker, occasional alcohol no  other tobacco no drug use 1 caffeinated beverage daily         Are you right handed or left handed? Right handed   Are you currently employed ? no   What is your current occupation?   Do you live at home alone? Yes    Who lives with you?    What type of home do you live in: 1 story or 2 story? Lives in a two story home and there is a Counselling psychologist         Vital Signs:  BP 123/69   Pulse 64   Ht 5\' 10"  (1.778 m)   Wt 178 lb (80.7 kg)   SpO2 98%   BMI 25.54 kg/m   Neurological Exam: MENTAL STATUS including orientation to time, place, person, recent and remote memory, attention span and concentration, language, and fund of knowledge is normal.  Speech is not dysarthric.  CRANIAL NERVES: II:  No visual field defects.     III-IV-VI: Pupils equal round and reactive to light.  Normal conjugate, extra-ocular eye movements in all directions of gaze.  No nystagmus.  No ptosis.   V:  Normal facial sensation.    VII:  Normal facial symmetry and movements.   VIII:  Normal hearing and vestibular function.   IX-X:  Normal palatal movement.   XI:  Normal shoulder shrug and head rotation.   XII:  Normal tongue strength and range of  motion, no deviation or fasciculation.  MOTOR:  No atrophy, fasciculations or abnormal movements.  No pronator drift.   Upper Extremity:  Right  Left  Deltoid  5/5   5/5   Biceps  5/5   5/5   Triceps  5/5   5/5   Wrist extensors  5/5   5/5   Wrist flexors  5/5   5/5   Finger extensors  5/5   5/5   Finger flexors  5/5   5/5   Dorsal interossei  5/5   5/5   Abductor pollicis  5/5   5/5   Tone (Ashworth scale)  0  0   Lower Extremity:  Right  Left  Hip flexors  5/5   5/5   Knee flexors  5/5   5/5   Knee extensors  5/5   5/5   Dorsiflexors  5/5   5/5   Plantarflexors  5/5   5/5   Toe extensors  5/5   5/5   Toe flexors  5/5   5/5   Tone (Ashworth scale)  0  0   MSRs:                                           Right        Left brachioradialis 2+  2+  biceps 2+  2+  triceps 2+  2+  patellar 3+  3+  ankle jerk 2+  2+  Hoffman no  no  plantar response down  down   SENSORY:  Normal and symmetric perception of light touch, pinprick, and vibration.  Romberg's sign absent.   COORDINATION/GAIT: Normal finger-to- nose-finger.  Intact rapid alternating movements bilaterally.  Able to rise from a chair without using arms.  Gait narrow based and stable. Stressed gait intact.  She is unsteady with tandem gait.     Thank you for allowing me to participate in patient's care.  If  I can answer any additional questions, I would be pleased to do so.    Sincerely,    Tyishia Aune K. Lydia Sams, DO

## 2024-03-30 ENCOUNTER — Ambulatory Visit: Payer: Self-pay | Admitting: Neurology

## 2024-03-30 ENCOUNTER — Ambulatory Visit: Admitting: Neurology

## 2024-03-30 DIAGNOSIS — R292 Abnormal reflex: Secondary | ICD-10-CM

## 2024-03-30 DIAGNOSIS — M5416 Radiculopathy, lumbar region: Secondary | ICD-10-CM

## 2024-03-30 DIAGNOSIS — M79604 Pain in right leg: Secondary | ICD-10-CM | POA: Diagnosis not present

## 2024-03-30 DIAGNOSIS — M79605 Pain in left leg: Secondary | ICD-10-CM | POA: Diagnosis not present

## 2024-03-30 NOTE — Procedures (Signed)
 [facility]  [address], [address]  [place], [place] [address] Tel: [phone] Fax: [phone] Test Date:  03/30/2024  Patient: Jackie Cooke DOB: [birth year] Physician: Reyna Cava, DO  Sex: Female Height: 5\' 10"  Ref Phys: Reyna Cava, DO  ID#: [id number]   Technician:    History: This is a 73 year old female referred for evaluation of bilateral feet pain.  NCV & EMG Findings: Extensive electrodiagnostic testing of the right lower extremity and additional studies of the left shows:  Bilateral sural and superficial peroneal sensory responses are within normal limits. Right peroneal motor response is asymmetrically reduced at the extensor digitorum brevis, normal at the tibialis anterior.  Left peroneal and bilateral tibial motor responses are within normal limits. Bilateral tibial H reflex studies prolonged.   Chronic motor axonal loss changes are seen affecting the L3-L4 myotomes bilaterally, without accompanying active denervation.  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 There is no evidence of a large fiber sensorimotor polyneuropathy affecting the lower extremities.   ___________________________ Reyna Cava, DO    Nerve Conduction Studies   Stim Site NR Peak (ms) Norm Peak (ms) O-P Amp (V) Norm O-P Amp  Left Sup Peroneal Anti Sensory (Ant Lat Mall)  32 C  12 cm    2.6 <4.6 3.7 >3  Right Sup Peroneal Anti Sensory (Ant Lat Mall)  32 C  12 cm    2.5 <4.6 5.1 >3  Left Sural Anti Sensory (Lat Mall)  32 C  Calf    2.6 <4.6 4.1 >3  Right Sural Anti Sensory (Lat Mall)  32 C  Calf    3.3 <4.6 5.2 >3     Stim Site NR Onset (ms) Norm Onset (ms) O-P Amp (mV) Norm O-P Amp Site1 Site2 Delta-0 (ms) Dist (cm) Vel (m/s) Norm Vel (m/s)  Left Peroneal Motor (Ext Dig Brev)  32 C  Ankle    4.5 <6.0 4.0 >2.5 B Fib Ankle 9.6 39.0 41 >40  B Fib    14.1  3.7  Poplt B Fib 1.5 8.0 53 >40  Poplt    15.6  3.7          Right Peroneal Motor (Ext Dig Brev)  32 C  Ankle    4.5 <6.0 *0.6 >2.5 B Fib Ankle 9.3 41.0 44 >40  B Fib    13.8  0.5  Poplt B Fib 1.7 8.0 47 >40  Poplt    15.5  0.3         Left Peroneal TA Motor (Tib Ant)  32 C  Fib Head    3.8 <4.5 3.7 >3 Poplit Fib Head 1.4 8.0 57 >40  Poplit    5.2 <5.7 3.3         Right Peroneal TA Motor (Tib Ant)  32 C  Fib Head    3.2 <4.5 3.9 >3 Poplit Fib Head 1.5 8.0 53 >40  Poplit    4.7 <5.7 3.8         Left Tibial Motor (Abd Hall Brev)  32 C  Ankle    3.8 <6.0 4.0 >4 Knee Ankle 11.3 45.0 40 >40  Knee    15.1  2.7         Right Tibial Motor (Abd Hall Brev)  32 C  Ankle    3.5 <6.0 5.8 >4 Knee Ankle 11.0 44.0 40 >40  Knee    14.5  3.5          Electromyography  Side Muscle Ins.Act Fibs Fasc Recrt Amp Dur Poly Activation Comment  Right AntTibialis Nml Nml Nml Nml Nml Nml Nml Nml N/A  Right Gastroc Nml Nml Nml Nml Nml Nml Nml Nml N/A  Right Flex Dig Long Nml Nml Nml Nml Nml Nml Nml Nml N/A  Right RectFemoris Nml Nml Nml *1- *1+ *1+ *1+ Nml N/A  Right GluteusMed Nml Nml Nml Nml Nml Nml Nml Nml N/A  Right AdductorLong Nml Nml Nml *1- *1+ *1+ *1+ Nml N/A  Left AntTibialis Nml Nml Nml Nml Nml Nml Nml Nml N/A  Left Gastroc Nml Nml Nml Nml Nml Nml Nml Nml N/A  Left Flex Dig Long Nml Nml Nml Nml Nml Nml Nml Nml N/A  Left RectFemoris Nml Nml Nml *1- *1+ *1+ *1+ Nml N/A  Left GluteusMed Nml Nml Nml Nml Nml Nml Nml Nml N/A      Waveforms:

## 2024-04-08 ENCOUNTER — Ambulatory Visit
Admission: RE | Admit: 2024-04-08 | Discharge: 2024-04-08 | Disposition: A | Discharge status: Home / Self Care | Source: Ambulatory Visit | Attending: Neurology | Admitting: Neurology

## 2024-04-08 DIAGNOSIS — R292 Abnormal reflex: Secondary | ICD-10-CM

## 2024-04-08 DIAGNOSIS — M79604 Pain in right leg: Secondary | ICD-10-CM

## 2024-04-09 ENCOUNTER — Ambulatory Visit (INDEPENDENT_AMBULATORY_CARE_PROVIDER_SITE_OTHER): Admitting: Neurology

## 2024-04-09 DIAGNOSIS — R202 Paresthesia of skin: Secondary | ICD-10-CM | POA: Diagnosis not present

## 2024-04-09 NOTE — Patient Instructions (Signed)
 INFORMATION FOR PATIENTS AFTER SKIN BIOPSY:   What You Need to Do:  1. Keep your current (large) bandage on for 24 hours and do not shower during this time. Leave today's bandage on until AFTER your next shower tomorrow.  Change your bandages every day starting tomorrow after that shower. Keep the bandages on while you shower. Change them once a day until a scab forms.  2. You can let the small steristrips fall off naturally and do not need to ever take them off.  They will eventually fall off on their own (after showering)  3. You may take showers after 24 hours, but DO NOT take tub baths, go in hot tubs or go swimming for seven days after the procedure.  4. You may use vasoline, bacitracin, or Polysporin ointment on the wounds as needed.  5. If a scab forms at the biopsy site, leave it alone.  6. If bleeding occurs, apply firm pressure for two minutes with a clean piece of gauze.   Please contact us  immediately if there is any redness, any signs of infection, or significant bleeding at the biopsy site.   Please call our office at [phone].

## 2024-04-09 NOTE — Progress Notes (Signed)
 Punch Biopsy Procedure Note  Preprocedure Diagnosis: paresthesia of skin   Postprocedure Diagnosis: same  Locations: Site 1: left lateral distal leg;  Site 2: left lateral thigh  Indications: r/o small fiber neuropathy  Anesthesia: 5 mL Lidocaine 1% with epinephrine  Procedure Details Patient informed of the risks (including but not limited to bleeding, pain, infection, scar and infection) and benefits of the procedure.  Informed consent obtained.  The areas which were chosen for biopsy, as above, and surrounding areas were given a sterile prep using alcohol and iodine. The skin was then stretched perpendicular to the skin tension lines and sample removed using the 3 mm punch. Pressure applied, hemostasis achieved.   Dressing applied. The specimen(s) was sent for pathologic examination. The patient tolerated the procedure well.  Estimated Blood Loss: 0 ml  Condition: Stable  Complications: none.  Plan: 1. Instructed to keep the wound dry and covered for 24h and clean thereafter. 2. Warning signs of infection were reviewed.    Jacquelyne Balint, MD [facility]

## 2024-04-17 ENCOUNTER — Other Ambulatory Visit: Admitting: Neurology

## 2024-05-02 ENCOUNTER — Ambulatory Visit: Admitting: Neurology

## 2024-05-04 ENCOUNTER — Encounter: Admitting: Neurology

## 2024-05-14 ENCOUNTER — Telehealth: Payer: Self-pay | Admitting: Neurology

## 2024-05-14 NOTE — Telephone Encounter (Signed)
 Pt. Cld for biopsy results

## 2024-05-14 NOTE — Telephone Encounter (Signed)
 Called pt and she wanted results before Thursday and the MRI results to [facility]. I will see what I can do with the skin biopsy and sent MRI results to [facility]. She understood,

## 2024-05-18 ENCOUNTER — Telehealth: Payer: Self-pay | Admitting: Neurology

## 2024-05-18 NOTE — Telephone Encounter (Signed)
 Pt. Calling Mahina bk

## 2024-05-18 NOTE — Telephone Encounter (Signed)
 Called patient and informed her of skin biopsy results and recommendations. Patient aware someone from the front will give her a call to get her scheduled.

## 2024-05-18 NOTE — Telephone Encounter (Signed)
 Please let pt know that skin biopsy shows small fiber neuropathy.  She can schedule follow-up visit with me to discuss this further.  Thanks.

## 2024-05-18 NOTE — Telephone Encounter (Signed)
 See previous encounter

## 2024-05-18 NOTE — Telephone Encounter (Signed)
 Called patient and left a message for a call back.

## 2024-05-22 ENCOUNTER — Ambulatory Visit (INDEPENDENT_AMBULATORY_CARE_PROVIDER_SITE_OTHER): Admitting: Neurology

## 2024-05-22 ENCOUNTER — Other Ambulatory Visit

## 2024-05-22 VITALS — BP 111/52 | HR 73 | Ht 70.0 in | Wt 174.0 lb

## 2024-05-22 DIAGNOSIS — M47816 Spondylosis without myelopathy or radiculopathy, lumbar region: Secondary | ICD-10-CM

## 2024-05-22 DIAGNOSIS — G629 Polyneuropathy, unspecified: Secondary | ICD-10-CM

## 2024-05-22 DIAGNOSIS — M79605 Pain in left leg: Secondary | ICD-10-CM

## 2024-05-22 NOTE — Progress Notes (Unsigned)
 Follow-up Visit   Date: 06-02-24    Jackie Cooke MRN: [id number] DOB: 1951/03/10    Jackie Cooke is a 73 y.o. right-handed Caucasian female with GERD, hypothyroidism, hypertension, depression, and chronic low back pain returning to the clinic for follow-up of small fiber neuropathy.  The patient was accompanied to the clinic by daughter who also provides collateral information.    IMPRESSION/PLAN: Small fiber neuropathy causing bilateral feet pain.  Confirmed by skin biopsy.    - Check ESR, CRP, vitamin B12, folate, copper, SPEP with IFE  - Increase gabapentin  by 300mg  every 3 days to a goal of 600mg  TID   2.  Lumbar spondylosis with lateral recess stenosis at L3-4 causing L4 nerve impingement.  This could explain her right medial thigh pain, however would not cause left leg/knee pain.  She does not have any severe narrowing affecting the left nerve roots. She is followed by Dr. Ibazebo and has received ESI for this in the past.   3.  Left posterior knee pain, polyarthralgia, and left ankle swelling.  She may want to see rheumatology for their opinion.  She was on steroids for a long time which controlled her pain better.   Return to clinic in 4 months  --------------------------------------------- History of present illness: For the past two years, she has chronic bilateral feet pain and burning sensation behind the left knee.  She also has sharp pain involving the heel and arches of the pain.  There is no specific trigger, such as activity or rest. She also has chronic low back pain and completed physical therapy, with no significant benefit.  She reports having ESI last week and had resolution of her feet and leg pain for about a day, and then it returned.    She takes gabapentin  300mg  three times daily.  She takes NSAIDs daily and applies volteran ointment which gives her most relief.  In the past, she was taking low does prednisone  which also controlled her pain, but  due to absence of primary autoimmune condition, this was stopped.     She moved from [place], [place]  in 2017 after her husband passed away.     UPDATE 2024/06/02: She is here for follow-up visit accompanied by her daughter.  Since her last visit, she had NCS/EMG of the legs which suggested L3-4 radiculopathy, no large fiber neuropathy.  MRI lumbar spine shows multilevel right neural foraminal narrowing, worse at the L3-4 right lateral recess stenosis possibly causing impingement on the right L4 nerve root.    Further testing with skin biopsy for small fiber neuropathy returned positive.  She continues to have left posterior knee pain, left ankle pain, and left ankle swelling. She has burning pain in the feet.  Medications:  Current Outpatient Medications on File Prior to Visit  Medication Sig Dispense Refill   acetaminophen  (TYLENOL ) 650 MG CR tablet Take 1,300 mg by mouth 2 (two) times daily.     cholecalciferol  (VITAMIN D ) 1000 units tablet Take 1,000 Units by mouth daily.     escitalopram  (LEXAPRO ) 10 MG tablet Take 10 mg by mouth.     estradiol (ESTRACE) 1 MG tablet Take 1 tablet by mouth daily.     fesoterodine (TOVIAZ) 8 MG TB24 tablet Take 1 tablet by mouth daily.     gabapentin  (NEURONTIN ) 300 MG capsule Take 1 capsule (300 mg total) by mouth at bedtime. 30 capsule 0   lansoprazole (PREVACID) 30 MG capsule Take 1 capsule by mouth daily.  levothyroxine  (SYNTHROID , LEVOTHROID) 137 MCG tablet Take 137 mcg by mouth daily before breakfast.      losartan-hydrochlorothiazide (HYZAAR) 50-12.5 MG tablet Take 1 tablet by mouth daily.     magnesium gluconate (MAGONATE) 500 (27 Mg) MG TABS tablet Take 500 mg by mouth in the morning and at bedtime.     NIFEdipine  (PROCARDIA -XL/ADALAT -CC/NIFEDICAL-XL) 30 MG 24 hr tablet Take 30 mg by mouth daily.      OVER THE COUNTER MEDICATION Citrcal     VOLTAREN 1 % GEL Apply 1 application topically 2 (two) times daily as needed for pain.  0   zolpidem  (AMBIEN ) 5  MG tablet Take 5 mg by mouth at bedtime as needed.  1   No current facility-administered medications on file prior to visit.    Allergies:  Allergies  Allergen Reactions   Morphine And Codeine Anaphylaxis and Shortness Of Breath    Patient states she does not have an allergy to Codeine    Celebrex [Celecoxib] Hives    Vital Signs:  BP (!) 111/52   Pulse 73   Ht 5' 10 (1.778 m)   Wt 174 lb (78.9 kg)   SpO2 96%   BMI 24.97 kg/m   Neurological Exam: MENTAL STATUS including orientation to time, place, person, recent and remote memory, attention span and concentration, language, and fund of knowledge is normal.  Speech is not dysarthric.  CRANIAL NERVES:   Pupils equal round and reactive to light.  Normal conjugate, extra-ocular eye movements in all directions of gaze.  No ptosis .  Face is symmetric.   MOTOR:  Motor strength is 5/5 in all extremities, including distally.  No atrophy, fasciculations or abnormal movements.  No pronator drift.  Tone is normal.    MSRs:  Reflexes are 2+/4 throughout, except 3+/4 at the patella bilaterally.  SENSORY:  Intact to vibration and temperature throughout.  COORDINATION/GAIT: Gait narrow based and stable.   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 There is no evidence of a large fiber sensorimotor polyneuropathy affecting the lower extremities.  MRI lumbar spine wo contrast 04/23/2024: 1. Bilateral facet arthropathy at L3-4 with joint effusions and periarticular edema, which can be a cause of back pain. 2. Severe narrowing of the right lateral recess at L3-4 with mass effect on the traversing right L4 nerve roots in the subarticular zone. 3. Moderate right neural foraminal narrowing at multiple levels, as detailed above.   Thank you for allowing me to participate in  patient's care.  If I can answer any additional questions, I would be pleased to do so.    Sincerely,    Royer Cristobal K. Tobie, DO

## 2024-05-22 NOTE — Patient Instructions (Signed)
 Increase gabapentin  by 300mg  tablet every 3 days to a goal 600mg  three times daily  Check labs

## 2024-05-23 ENCOUNTER — Telehealth: Payer: Self-pay | Admitting: Neurology

## 2024-05-23 NOTE — Telephone Encounter (Signed)
 Called patient and informed her that medical records have been faxed as requested. Patient verbalized understanding and had no further questions or concerns.

## 2024-05-23 NOTE — Telephone Encounter (Signed)
 Please fax my 7/29 note to [facility] rheumatology as requested and Dr. Ibazebo at [facility]. Thanks.

## 2024-05-23 NOTE — Telephone Encounter (Signed)
 Pt. Would like medical notes from Dr. Tobie sent to Fax [phone] [facility] Dr. Mai phn [phone]

## 2024-05-26 LAB — COPPER, SERUM: Copper: 157 ug/dL (ref 70–175)

## 2024-05-26 LAB — PROTEIN ELECTROPHORESIS, SERUM
Albumin ELP: 4.1 g/dL (ref 3.8–4.8)
Alpha 1: 0.3 g/dL (ref 0.2–0.3)
Alpha 2: 0.7 g/dL (ref 0.5–0.9)
Beta 2: 0.3 g/dL (ref 0.2–0.5)
Beta Globulin: 0.5 g/dL (ref 0.4–0.6)
Gamma Globulin: 0.9 g/dL (ref 0.8–1.7)
Total Protein: 6.8 g/dL (ref 6.1–8.1)

## 2024-05-26 LAB — C-REACTIVE PROTEIN: CRP: 3.5 mg/L (ref <8.0)

## 2024-05-26 LAB — SEDIMENTATION RATE: Sed Rate: 9 mm/h (ref 0–30)

## 2024-05-26 LAB — IMMUNOFIXATION ELECTROPHORESIS
IgG (Immunoglobin G), Serum: 912 mg/dL (ref 600–1540)
IgM, Serum: 166 mg/dL (ref 50–300)
Immunoglobulin A: 128 mg/dL (ref 70–320)

## 2024-05-26 LAB — VITAMIN B12: Vitamin B-12: 678 pg/mL (ref 200–1100)

## 2024-05-26 LAB — FOLATE: Folate: 18.8 ng/mL

## 2024-05-28 ENCOUNTER — Ambulatory Visit: Payer: Self-pay | Admitting: Neurology

## 2024-06-04 ENCOUNTER — Encounter: Payer: Self-pay | Admitting: Neurology

## 2024-06-04 ENCOUNTER — Telehealth: Payer: Self-pay | Admitting: Neurology

## 2024-06-04 MED ORDER — GABAPENTIN 300 MG PO CAPS: 600.0000 mg | ORAL_CAPSULE | Freq: Three times a day (TID) | ORAL | 5 refills | Status: DC

## 2024-06-04 NOTE — Telephone Encounter (Signed)
 Rx sent. Biopsy results will not be on Mychart since it is a scanned document.  OK to mail copy.

## 2024-06-04 NOTE — Telephone Encounter (Signed)
 Patient said she is having a hard time finding the results of her EMG and Biopsy Dr.Hill done in June.   She needs a refill on the correct dosing of her gabapentin  300mg . 600mg  3x a day.  [facility] [address]

## 2024-06-05 NOTE — Telephone Encounter (Signed)
 Patient aware we will be mailing a copy of results to her of EMG and biopsy.

## 2024-07-09 ENCOUNTER — Other Ambulatory Visit: Payer: Self-pay | Admitting: Medical Genetics

## 2024-07-19 ENCOUNTER — Other Ambulatory Visit

## 2024-07-25 NOTE — Progress Notes (Signed)
Sent message, via epic in basket, requesting order in epic from surgeon  

## 2024-07-26 ENCOUNTER — Ambulatory Visit: Payer: Self-pay | Admitting: Emergency Medicine

## 2024-07-26 DIAGNOSIS — G8929 Other chronic pain: Secondary | ICD-10-CM

## 2024-07-26 NOTE — H&P (Signed)
 TOTAL KNEE REVISION ADMISSION H&P  Patient is being admitted for left revision total knee arthroplasty.  Subjective:  Chief Complaint:left knee pain.  HPI: Jackie Cooke, 72 y.o. female, has a history of pain and functional disability in the left knee(s) due to increased instability s/p total knee arthroplasty and patient has failed non-surgical conservative treatments for greater than 12 weeks to include NSAID's and/or analgesics, supervised PT with diminished ADL's post treatment, use of assistive devices, and activity modification. The indications for the revision of the total knee arthroplasty are increased instability of left knee. Onset of symptoms was gradual starting 6 years ago with gradually worsening course since that time.  Prior procedures on the left knee(s) include arthroplasty.  Patient currently rates pain in the left knee(s) at 9 out of 10 with activity. There is night pain, worsening of pain with activity and weight bearing, pain that interferes with activities of daily living, and pain with passive range of motion.  Patient has evidence of no significant loosening of hardware by imaging studies. This condition presents safety issues increasing the risk of falls.  There is no current active infection.  Patient Active Problem List   Diagnosis Date Noted   Primary localized osteoarthritis of right knee 07/15/2018   Painful retained orthopaedic hardware right knee(HCC) 07/15/2018   S/P total knee replacement, left 07/15/2018   Leukocytosis 03/07/2018   Baker's cyst of knee, left 03/07/2018   Primary localized osteoarthritis of left knee 02/20/2018   Essential hypertension, benign 02/20/2018   Hypothyroidism 02/20/2018   Past Medical History:  Diagnosis Date   Arthritis    Baker's cyst of knee, left 03/07/2018   Essential hypertension, benign 02/20/2018   Functional dyspepsia    GERD (gastroesophageal reflux disease)    at times takes over the counter meds    H. pylori  infection    Hypertension    Hypothyroidism 02/20/2018   Painful retained orthopaedic hardware right knee(HCC) 07/15/2018   PONV (postoperative nausea and vomiting)    Primary localized osteoarthritis of left knee 02/20/2018   Primary localized osteoarthritis of right knee 07/15/2018   S/P total knee replacement, left 07/15/2018   Thyroid disease     Past Surgical History:  Procedure Laterality Date   ABDOMINAL HYSTERECTOMY     BLADDER REPAIR     CHOLECYSTECTOMY     COLONOSCOPY     ESOPHAGOGASTRODUODENOSCOPY     HARDWARE REMOVAL Right 09/04/2018   Procedure: HARDWARE REMOVAL;  Surgeon: Jane Charleston, MD;  Location: [facility] OR;  Service: Orthopedics;  Laterality: Right;   KNEE SURGERY Right    removed ovary  Bilateral    TOTAL HIP ARTHROPLASTY Right 1988   TOTAL KNEE ARTHROPLASTY Left 03/06/2018   Procedure: TOTAL KNEE ARTHROPLASTY;  Surgeon: Jane Charleston, MD;  Location: [facility] OR;  Service: Orthopedics;  Laterality: Left;   TOTAL KNEE ARTHROPLASTY Right 09/04/2018   Procedure: TOTAL KNEE ARTHROPLASTY;  Surgeon: Jane Charleston, MD;  Location: [facility] OR;  Service: Orthopedics;  Laterality: Right;   TOTAL THYROIDECTOMY  2007    Current Outpatient Medications  Medication Sig Dispense Refill Last Dose/Taking   acetaminophen  (TYLENOL ) 500 MG tablet Take 1,500 mg by mouth in the morning and at bedtime.      Biotin 89999 MCG TBDP Take 10,000 mcg by mouth in the morning.      Calcium Citrate (CITRACAL PO) Take 1 tablet by mouth in the morning.      Cyanocobalamin (VITAMIN B-12 PO) Take 1 tablet by mouth in the  morning.      escitalopram  (LEXAPRO ) 10 MG tablet Take 10 mg by mouth in the morning.      estradiol (ESTRACE) 1 MG tablet Take 1 mg by mouth in the morning.      fesoterodine (TOVIAZ) 8 MG TB24 tablet Take 8 mg by mouth in the morning.      gabapentin  (NEURONTIN ) 300 MG capsule Take 2 capsules (600 mg total) by mouth 3 (three) times daily. 180 capsule 5    ibuprofen (ADVIL) 200 MG tablet Take 600  mg by mouth daily.      lansoprazole (PREVACID) 30 MG capsule Take 30 mg by mouth daily before breakfast.      levothyroxine  (SYNTHROID , LEVOTHROID) 137 MCG tablet Take 137 mcg by mouth daily before breakfast.       losartan-hydrochlorothiazide (HYZAAR) 50-12.5 MG tablet Take 1 tablet by mouth in the morning.      magnesium gluconate (MAGONATE) 500 (27 Mg) MG TABS tablet Take 500 mg by mouth 2 (two) times a week. At night.      NIFEdipine  (PROCARDIA -XL/ADALAT -CC/NIFEDICAL-XL) 30 MG 24 hr tablet Take 30 mg by mouth in the morning.      VITAMIN D  PO Take 2,000 Units by mouth in the morning.      VOLTAREN 1 % GEL Apply 1 application topically 2 (two) times daily as needed for pain.  0    zolpidem  (AMBIEN ) 5 MG tablet Take 5 mg by mouth at bedtime as needed for sleep.  1    No current facility-administered medications for this visit.   Allergies  Allergen Reactions   Morphine Anaphylaxis and Shortness Of Breath   Celebrex [Celecoxib] Hives    Social History   Tobacco Use   Smoking status: Former    Current packs/day: 0.50    Average packs/day: 0.5 packs/day for 10.0 years (5.0 ttl pk-yrs)    Types: Cigarettes   Smokeless tobacco: Never   Tobacco comments:    quit 27 yrs. ago  Substance Use Topics   Alcohol use: Yes    Comment: occasional     Family History  Problem Relation Age of Onset   Hypertension Mother    Other Mother        Massive Brain Bleed   Pulmonary fibrosis Father    Thyroid cancer Sister    Diabetes Mellitus II Brother    Colon cancer Maternal Uncle    Esophageal cancer Neg Hx    Liver cancer Neg Hx    Rectal cancer Neg Hx    Stomach cancer Neg Hx       Review of Systems  Musculoskeletal:  Positive for arthralgias.  All other systems reviewed and are negative.    Objective:  Physical Exam Constitutional:      General: She is not in acute distress.    Appearance: Normal appearance. She is not ill-appearing.  HENT:     Head: Normocephalic and  atraumatic.     Right Ear: External ear normal.     Left Ear: External ear normal.     Nose: Nose normal.     Mouth/Throat:     Mouth: Mucous membranes are moist.     Pharynx: Oropharynx is clear.  Eyes:     Extraocular Movements: Extraocular movements intact.     Conjunctiva/sclera: Conjunctivae normal.  Cardiovascular:     Rate and Rhythm: Normal rate and regular rhythm.     Pulses: Normal pulses.     Heart sounds: Normal heart sounds.  Pulmonary:  Effort: Pulmonary effort is normal.     Breath sounds: Normal breath sounds.  Abdominal:     General: Bowel sounds are normal.     Palpations: Abdomen is soft.     Tenderness: There is no abdominal tenderness.  Musculoskeletal:        General: Tenderness present.     Cervical back: Normal range of motion and neck supple.     Comments: TTP globally over left knee.  No calf tenderness, swelling, or erythema.  Well healed incision, otherwise no overlying lesions of area of chief complaint.  Decreased strength and ROM due to elicited pain.  Pre-operative ROM 0-135.  Dorsiflexion and plantarflexion intact.  Generally stable to varus and valgus stress though with increased laxity.  BLE appear grossly neurovascularly intact.  Gait mildly antalgic.   Skin:    General: Skin is warm and dry.  Neurological:     Mental Status: She is alert and oriented to person, place, and time. Mental status is at baseline.  Psychiatric:        Mood and Affect: Mood normal.        Behavior: Behavior normal.     Vital signs in last 24 hours: @VSRANGES @  Labs:  Estimated body mass index is 24.97 kg/m as calculated from the following:   Height as of 05/22/24: 5' 10 (1.778 m).   Weight as of 05/22/24: 78.9 kg.  Imaging Review Plain radiographs demonstrate femoral and tibial components in good position without adverse features of the left knee(s). The overall alignment is neutral. The bone quality appears to be fair for age and reported activity level.       Assessment/Plan:  Worsening instability, left knee(s) with failed previous arthroplasty.   The patient history, physical examination, clinical judgment of the provider and imaging studies are consistent with worsening instability of the left knee(s), previous total knee arthroplasty. Revision total knee arthroplasty is deemed medically necessary, plan for poly exchange though may require more extensive revision dependent on surgical findings. The treatment options including medical management, injection therapy, arthroscopy and revision arthroplasty were discussed at length. The risks and benefits of revision total knee arthroplasty were presented and reviewed. The risks due to aseptic loosening, infection, stiffness, patella tracking problems, thromboembolic complications and other imponderables were discussed. The patient acknowledged the explanation, agreed to proceed with the plan and consent was signed. Patient is being admitted for inpatient treatment for surgery, pain control, PT, OT, prophylactic antibiotics, VTE prophylaxis, progressive ambulation and ADL's and discharge planning.The patient is planning to be discharged home with home health services ([facility]).

## 2024-07-26 NOTE — Patient Instructions (Signed)
 SURGICAL WAITING ROOM VISITATION  Patients having surgery or a procedure may have no more than 2 support people in the waiting area - these visitors may rotate.    Children under the age of 26 must have an adult with them who is not the patient.  Visitors with respiratory illnesses are discouraged from visiting and should remain at home.  If the patient needs to stay at the hospital during part of their recovery, the visitor guidelines for inpatient rooms apply. Pre-op nurse will coordinate an appropriate time for 1 support person to accompany patient in pre-op.  This support person may not rotate.    Please refer to the [facility] website for the visitor guidelines for Inpatients (after your surgery is over and you are in a regular room).       Your procedure is scheduled on: 08-06-24   Report to [facility] Main Entrance    Report to admitting at       0800  AM   Call this number if you have problems the morning of surgery [phone]   Do not eat food :After Midnight.   After Midnight you may have the following liquids until _0725_____ AM/  DAY OF SURGERY   then nothing by mouth  Water Non-Citrus Juices (without pulp, NO RED-Apple, White grape, White cranberry) Black Coffee (NO MILK/CREAM OR CREAMERS, sugar ok)  Clear Tea (NO MILK/CREAM OR CREAMERS, sugar ok) regular and decaf                             Plain Jell-O (NO RED)                                           Fruit ices (not with fruit pulp, NO RED)                                     Popsicles (NO RED)                                                               Sports drinks like Gatorade (NO RED)                    The day of surgery:  Drink ONE (1) Pre-Surgery Clear Ensure BY     0725  AM the morning of surgery. Drink in one sitting. Do not sip.  This drink was given to you during your hospital  pre-op appointment visit. Nothing else to drink after completing the  Pre-Surgery Clear Ensure .           If you have questions, please contact your surgeon's office.   FOLLOW  ANY ADDITIONAL PRE OP INSTRUCTIONS YOU RECEIVED FROM YOUR SURGEON'S OFFICE!!!     Oral Hygiene is also important to reduce your risk of infection.                                    Remember - BRUSH YOUR  TEETH THE MORNING OF SURGERY WITH YOUR REGULAR TOOTHPASTE  DENTURES WILL BE REMOVED PRIOR TO SURGERY PLEASE DO NOT APPLY Poly grip OR ADHESIVES!!!   Do NOT smoke after Midnight   Stop all vitamins and herbal supplements 7 days before surgery.   Take these medicines the morning of surgery with A SIP OF WATER: Nifedipine , levothyroxine , lansoprazole, gabapentin , fesoterodine, lexapro     Bring CPAP mask and tubing day of surgery.                              You may not have any metal on your body including hair pins, jewelry, and body piercing             Do not wear make-up, lotions, powders, perfumes/cologne, or deodorant  Do not wear nail polish including gel and S&S, artificial/acrylic nails, or any other type of covering on natural nails including finger and toenails. If you have artificial nails, gel coating, etc. that needs to be removed by a nail salon please have this removed prior to surgery or surgery may need to be canceled/ delayed if the surgeon/ anesthesia feels like they are unable to be safely monitored.   Do not shave  48 hours prior to surgery.    Do not bring valuables to the hospital. [facility] IS NOT             RESPONSIBLE   FOR VALUABLES.   Contacts, glasses, dentures or bridgework may not be worn into surgery.   Bring small overnight bag day of surgery.   DO NOT BRING YOUR HOME MEDICATIONS TO THE HOSPITAL. PHARMACY WILL DISPENSE MEDICATIONS LISTED ON YOUR MEDICATION LIST TO YOU DURING YOUR ADMISSION IN THE HOSPITAL!    Patients discharged on the day of surgery will not be allowed to drive home.  Someone NEEDS to stay with you for the first 24 hours after  anesthesia.   Special Instructions: Bring a copy of your healthcare power of attorney and living will documents the day of surgery if you haven't scanned them before.              Please read over the following fact sheets you were given: IF YOU HAVE QUESTIONS ABOUT YOUR PRE-OP INSTRUCTIONS PLEASE CALL [phone].    If you test positive for Covid or have been in contact with anyone that has tested positive in the last 10 days please notify you surgeon.      Pre-operative 4 CHG Bath Instructions  DYNA-Hex 4 Chlorhexidine  Gluconate 4% Solution Antiseptic 4 fl. oz   You can play a key role in reducing the risk of infection after surgery. Your skin needs to be as free of germs as possible. You can reduce the number of germs on your skin by washing with CHG (chlorhexidine  gluconate) soap before surgery. CHG is an antiseptic soap that kills germs and continues to kill germs even after washing.   DO NOT use if you have an allergy to chlorhexidine /CHG or antibacterial soaps. If your skin becomes reddened or irritated, stop using the CHG and notify one of our RNs at   Please shower with the CHG soap starting 4 days before surgery using the following schedule:     Please keep in mind the following:  DO NOT shave, including legs and underarms, starting the day of your first shower.   You may shave your face at any point before/day of surgery.  Place clean sheets  on your bed the day you start using CHG soap. Use a clean washcloth (not used since being washed) for each shower. DO NOT sleep with pets once you start using the CHG.  CHG Shower Instructions:  If you choose to wash your hair and private area, wash first with your normal shampoo/soap.  After you use shampoo/soap, rinse your hair and body thoroughly to remove shampoo/soap residue.  Turn the water OFF and apply about 3 tablespoons (45 ml) of CHG soap to a CLEAN washcloth.  Apply CHG soap ONLY FROM YOUR NECK DOWN TO YOUR TOES (washing for  3-5 minutes)  DO NOT use CHG soap on face, private areas, open wounds, or sores.  Pay special attention to the area where your surgery is being performed.  If you are having back surgery, having someone wash your back for you may be helpful. Wait 2 minutes after CHG soap is applied, then you may rinse off the CHG soap.  Pat dry with a clean towel  Put on clean clothes/pajamas   If you choose to wear lotion, please use ONLY the CHG-compatible lotions on the back of this paper.     Additional instructions for the day of surgery: DO NOT APPLY any lotions, deodorants, cologne, or perfumes.   Put on clean/comfortable clothes.  Brush your teeth.  Ask your nurse before applying any prescription medications to the skin.   CHG Compatible Lotions   Aveeno Moisturizing lotion  Cetaphil Moisturizing Cream  Cetaphil Moisturizing Lotion  Clairol Herbal Essence Moisturizing Lotion, Dry Skin  Clairol Herbal Essence Moisturizing Lotion, Extra Dry Skin  Clairol Herbal Essence Moisturizing Lotion, Normal Skin  Curel Age Defying Therapeutic Moisturizing Lotion with Alpha Hydroxy  Curel Extreme Care Body Lotion  Curel Soothing Hands Moisturizing Hand Lotion  Curel Therapeutic Moisturizing Cream, Fragrance-Free  Curel Therapeutic Moisturizing Lotion, Fragrance-Free  Curel Therapeutic Moisturizing Lotion, Original Formula  Eucerin Daily Replenishing Lotion  Eucerin Dry Skin Therapy Plus Alpha Hydroxy Crme  Eucerin Dry Skin Therapy Plus Alpha Hydroxy Lotion  Eucerin Original Crme  Eucerin Original Lotion  Eucerin Plus Crme Eucerin Plus Lotion  Eucerin TriLipid Replenishing Lotion  Keri Anti-Bacterial Hand Lotion  Keri Deep Conditioning Original Lotion Dry Skin Formula Softly Scented  Keri Deep Conditioning Original Lotion, Fragrance Free Sensitive Skin Formula  Keri Lotion Fast Absorbing Fragrance Free Sensitive Skin Formula  Keri Lotion Fast Absorbing Softly Scented Dry Skin Formula  Keri  Original Lotion  Keri Skin Renewal Lotion Keri Silky Smooth Lotion  Keri Silky Smooth Sensitive Skin Lotion  Nivea Body Creamy Conditioning Oil  Nivea Body Extra Enriched Lotion  Nivea Body Original Lotion  Nivea Body Sheer Moisturizing Lotion Nivea Crme  Nivea Skin Firming Lotion  NutraDerm 30 Skin Lotion  NutraDerm Skin Lotion  NutraDerm Therapeutic Skin Cream  NutraDerm Therapeutic Skin Lotion  ProShield Protective Hand Cream   Incentive Spirometer  An incentive spirometer is a tool that can help keep your lungs clear and active. This tool measures how well you are filling your lungs with each breath. Taking long deep breaths may help reverse or decrease the chance of developing breathing (pulmonary) problems (especially infection) following: A long period of time when you are unable to move or be active. BEFORE THE PROCEDURE  If the spirometer includes an indicator to show your best effort, your nurse or respiratory therapist will set it to a desired goal. If possible, sit up straight or lean slightly forward. Try not to slouch. Hold the incentive spirometer in  an upright position. INSTRUCTIONS FOR USE  Sit on the edge of your bed if possible, or sit up as far as you can in bed or on a chair. Hold the incentive spirometer in an upright position. Breathe out normally. Place the mouthpiece in your mouth and seal your lips tightly around it. Breathe in slowly and as deeply as possible, raising the piston or the ball toward the top of the column. Hold your breath for 3-5 seconds or for as long as possible. Allow the piston or ball to fall to the bottom of the column. Remove the mouthpiece from your mouth and breathe out normally. Rest for a few seconds and repeat Steps 1 through 7 at least 10 times every 1-2 hours when you are awake. Take your time and take a few normal breaths between deep breaths. The spirometer may include an indicator to show your best effort. Use the indicator  as a goal to work toward during each repetition. After each set of 10 deep breaths, practice coughing to be sure your lungs are clear. If you have an incision (the cut made at the time of surgery), support your incision when coughing by placing a pillow or rolled up towels firmly against it. Once you are able to get out of bed, walk around indoors and cough well. You may stop using the incentive spirometer when instructed by your caregiver.  RISKS AND COMPLICATIONS Take your time so you do not get dizzy or light-headed. If you are in pain, you may need to take or ask for pain medication before doing incentive spirometry. It is harder to take a deep breath if you are having pain. AFTER USE Rest and breathe slowly and easily. It can be helpful to keep track of a log of your progress. Your caregiver can provide you with a simple table to help with this. If you are using the spirometer at home, follow these instructions: SEEK MEDICAL CARE IF:  You are having difficultly using the spirometer. You have trouble using the spirometer as often as instructed. Your pain medication is not giving enough relief while using the spirometer. You develop fever of 100.5 F (38.1 C) or higher. SEEK IMMEDIATE MEDICAL CARE IF:  You cough up bloody sputum that had not been present before. You develop fever of 102 F (38.9 C) or greater. You develop worsening pain at or near the incision site. MAKE SURE YOU:  Understand these instructions. Will watch your condition. Will get help right away if you are not doing well or get worse. Document Released: 02/21/2007 Document Revised: 01/03/2012 Document Reviewed: 04/24/2007 [facility] Patient Information 2014 [place], [place].   ________________________________________________________________________

## 2024-07-26 NOTE — Progress Notes (Addendum)
 PCP - Tammy Boyd,MD LOV 07-17-24 epic  [place] PIETY Cardiologist - n/a Neurology-LOV 05-22-24 epic Dr. Tonita Blanch   PPM/ICD -  Device Orders -  Rep Notified -   Chest x-ray -  EKG - preop Stress Test -  ECHO -  Cardiac Cath -  CT cardiac-03-05-24 CE   Sleep Study -  CPAP -   Fasting Blood Sugar -  Checks Blood Sugar __n/a___ times a day  Blood Thinner Instructions:n/a Aspirin  Instructions:n/a  ERAS Protcol - PRE-SURGERY Ensure      Activity--Able to climb a flight of stairs with CP or SOB Anesthesia review: HTN  Patient denies shortness of breath, fever, cough and chest pain at PAT appointment   All instructions explained to the patient, with a verbal understanding of the material. Patient agrees to go over the instructions while at home for a better understanding. Patient also instructed to self quarantine after being tested for COVID-19. The opportunity to ask questions was provided.

## 2024-07-26 NOTE — H&P (View-Only) (Signed)
 TOTAL KNEE REVISION ADMISSION H&P  Patient is being admitted for left revision total knee arthroplasty.  Subjective:  Chief Complaint:left knee pain.  HPI: Jackie Cooke, 72 y.o. female, has a history of pain and functional disability in the left knee(s) due to increased instability s/p total knee arthroplasty and patient has failed non-surgical conservative treatments for greater than 12 weeks to include NSAID's and/or analgesics, supervised PT with diminished ADL's post treatment, use of assistive devices, and activity modification. The indications for the revision of the total knee arthroplasty are increased instability of left knee. Onset of symptoms was gradual starting 6 years ago with gradually worsening course since that time.  Prior procedures on the left knee(s) include arthroplasty.  Patient currently rates pain in the left knee(s) at 9 out of 10 with activity. There is night pain, worsening of pain with activity and weight bearing, pain that interferes with activities of daily living, and pain with passive range of motion.  Patient has evidence of no significant loosening of hardware by imaging studies. This condition presents safety issues increasing the risk of falls.  There is no current active infection.  Patient Active Problem List   Diagnosis Date Noted   Primary localized osteoarthritis of right knee 07/15/2018   Painful retained orthopaedic hardware right knee(HCC) 07/15/2018   S/P total knee replacement, left 07/15/2018   Leukocytosis 03/07/2018   Baker's cyst of knee, left 03/07/2018   Primary localized osteoarthritis of left knee 02/20/2018   Essential hypertension, benign 02/20/2018   Hypothyroidism 02/20/2018   Past Medical History:  Diagnosis Date   Arthritis    Baker's cyst of knee, left 03/07/2018   Essential hypertension, benign 02/20/2018   Functional dyspepsia    GERD (gastroesophageal reflux disease)    at times takes over the counter meds    H. pylori  infection    Hypertension    Hypothyroidism 02/20/2018   Painful retained orthopaedic hardware right knee(HCC) 07/15/2018   PONV (postoperative nausea and vomiting)    Primary localized osteoarthritis of left knee 02/20/2018   Primary localized osteoarthritis of right knee 07/15/2018   S/P total knee replacement, left 07/15/2018   Thyroid disease     Past Surgical History:  Procedure Laterality Date   ABDOMINAL HYSTERECTOMY     BLADDER REPAIR     CHOLECYSTECTOMY     COLONOSCOPY     ESOPHAGOGASTRODUODENOSCOPY     HARDWARE REMOVAL Right 09/04/2018   Procedure: HARDWARE REMOVAL;  Surgeon: Jane Charleston, MD;  Location: [facility] OR;  Service: Orthopedics;  Laterality: Right;   KNEE SURGERY Right    removed ovary  Bilateral    TOTAL HIP ARTHROPLASTY Right 1988   TOTAL KNEE ARTHROPLASTY Left 03/06/2018   Procedure: TOTAL KNEE ARTHROPLASTY;  Surgeon: Jane Charleston, MD;  Location: [facility] OR;  Service: Orthopedics;  Laterality: Left;   TOTAL KNEE ARTHROPLASTY Right 09/04/2018   Procedure: TOTAL KNEE ARTHROPLASTY;  Surgeon: Jane Charleston, MD;  Location: [facility] OR;  Service: Orthopedics;  Laterality: Right;   TOTAL THYROIDECTOMY  2007    Current Outpatient Medications  Medication Sig Dispense Refill Last Dose/Taking   acetaminophen  (TYLENOL ) 500 MG tablet Take 1,500 mg by mouth in the morning and at bedtime.      Biotin 89999 MCG TBDP Take 10,000 mcg by mouth in the morning.      Calcium Citrate (CITRACAL PO) Take 1 tablet by mouth in the morning.      Cyanocobalamin (VITAMIN B-12 PO) Take 1 tablet by mouth in the  morning.      escitalopram  (LEXAPRO ) 10 MG tablet Take 10 mg by mouth in the morning.      estradiol (ESTRACE) 1 MG tablet Take 1 mg by mouth in the morning.      fesoterodine (TOVIAZ) 8 MG TB24 tablet Take 8 mg by mouth in the morning.      gabapentin  (NEURONTIN ) 300 MG capsule Take 2 capsules (600 mg total) by mouth 3 (three) times daily. 180 capsule 5    ibuprofen (ADVIL) 200 MG tablet Take 600  mg by mouth daily.      lansoprazole (PREVACID) 30 MG capsule Take 30 mg by mouth daily before breakfast.      levothyroxine  (SYNTHROID , LEVOTHROID) 137 MCG tablet Take 137 mcg by mouth daily before breakfast.       losartan-hydrochlorothiazide (HYZAAR) 50-12.5 MG tablet Take 1 tablet by mouth in the morning.      magnesium gluconate (MAGONATE) 500 (27 Mg) MG TABS tablet Take 500 mg by mouth 2 (two) times a week. At night.      NIFEdipine  (PROCARDIA -XL/ADALAT -CC/NIFEDICAL-XL) 30 MG 24 hr tablet Take 30 mg by mouth in the morning.      VITAMIN D  PO Take 2,000 Units by mouth in the morning.      VOLTAREN 1 % GEL Apply 1 application topically 2 (two) times daily as needed for pain.  0    zolpidem  (AMBIEN ) 5 MG tablet Take 5 mg by mouth at bedtime as needed for sleep.  1    No current facility-administered medications for this visit.   Allergies  Allergen Reactions   Morphine Anaphylaxis and Shortness Of Breath   Celebrex [Celecoxib] Hives    Social History   Tobacco Use   Smoking status: Former    Current packs/day: 0.50    Average packs/day: 0.5 packs/day for 10.0 years (5.0 ttl pk-yrs)    Types: Cigarettes   Smokeless tobacco: Never   Tobacco comments:    quit 27 yrs. ago  Substance Use Topics   Alcohol use: Yes    Comment: occasional     Family History  Problem Relation Age of Onset   Hypertension Mother    Other Mother        Massive Brain Bleed   Pulmonary fibrosis Father    Thyroid cancer Sister    Diabetes Mellitus II Brother    Colon cancer Maternal Uncle    Esophageal cancer Neg Hx    Liver cancer Neg Hx    Rectal cancer Neg Hx    Stomach cancer Neg Hx       Review of Systems  Musculoskeletal:  Positive for arthralgias.  All other systems reviewed and are negative.    Objective:  Physical Exam Constitutional:      General: She is not in acute distress.    Appearance: Normal appearance. She is not ill-appearing.  HENT:     Head: Normocephalic and  atraumatic.     Right Ear: External ear normal.     Left Ear: External ear normal.     Nose: Nose normal.     Mouth/Throat:     Mouth: Mucous membranes are moist.     Pharynx: Oropharynx is clear.  Eyes:     Extraocular Movements: Extraocular movements intact.     Conjunctiva/sclera: Conjunctivae normal.  Cardiovascular:     Rate and Rhythm: Normal rate and regular rhythm.     Pulses: Normal pulses.     Heart sounds: Normal heart sounds.  Pulmonary:  Effort: Pulmonary effort is normal.     Breath sounds: Normal breath sounds.  Abdominal:     General: Bowel sounds are normal.     Palpations: Abdomen is soft.     Tenderness: There is no abdominal tenderness.  Musculoskeletal:        General: Tenderness present.     Cervical back: Normal range of motion and neck supple.     Comments: TTP globally over left knee.  No calf tenderness, swelling, or erythema.  Well healed incision, otherwise no overlying lesions of area of chief complaint.  Decreased strength and ROM due to elicited pain.  Pre-operative ROM 0-135.  Dorsiflexion and plantarflexion intact.  Generally stable to varus and valgus stress though with increased laxity.  BLE appear grossly neurovascularly intact.  Gait mildly antalgic.   Skin:    General: Skin is warm and dry.  Neurological:     Mental Status: She is alert and oriented to person, place, and time. Mental status is at baseline.  Psychiatric:        Mood and Affect: Mood normal.        Behavior: Behavior normal.     Vital signs in last 24 hours: @VSRANGES @  Labs:  Estimated body mass index is 24.97 kg/m as calculated from the following:   Height as of 05/22/24: 5' 10 (1.778 m).   Weight as of 05/22/24: 78.9 kg.  Imaging Review Plain radiographs demonstrate femoral and tibial components in good position without adverse features of the left knee(s). The overall alignment is neutral. The bone quality appears to be fair for age and reported activity level.       Assessment/Plan:  Worsening instability, left knee(s) with failed previous arthroplasty.   The patient history, physical examination, clinical judgment of the provider and imaging studies are consistent with worsening instability of the left knee(s), previous total knee arthroplasty. Revision total knee arthroplasty is deemed medically necessary, plan for poly exchange though may require more extensive revision dependent on surgical findings. The treatment options including medical management, injection therapy, arthroscopy and revision arthroplasty were discussed at length. The risks and benefits of revision total knee arthroplasty were presented and reviewed. The risks due to aseptic loosening, infection, stiffness, patella tracking problems, thromboembolic complications and other imponderables were discussed. The patient acknowledged the explanation, agreed to proceed with the plan and consent was signed. Patient is being admitted for inpatient treatment for surgery, pain control, PT, OT, prophylactic antibiotics, VTE prophylaxis, progressive ambulation and ADL's and discharge planning.The patient is planning to be discharged home with home health services ([facility]).

## 2024-07-31 ENCOUNTER — Encounter (HOSPITAL_COMMUNITY)
Admission: RE | Admit: 2024-07-31 | Discharge: 2024-07-31 | Disposition: A | Discharge status: Home / Self Care | Source: Ambulatory Visit | Attending: Orthopedic Surgery | Admitting: Orthopedic Surgery

## 2024-07-31 ENCOUNTER — Encounter (HOSPITAL_COMMUNITY): Payer: Self-pay

## 2024-07-31 ENCOUNTER — Other Ambulatory Visit: Payer: Self-pay

## 2024-07-31 VITALS — BP 127/89 | HR 59 | Temp 98.0°F | Resp 16 | Ht 67.5 in | Wt 170.0 lb

## 2024-07-31 DIAGNOSIS — I10 Essential (primary) hypertension: Secondary | ICD-10-CM | POA: Diagnosis not present

## 2024-07-31 DIAGNOSIS — G8929 Other chronic pain: Secondary | ICD-10-CM | POA: Diagnosis not present

## 2024-07-31 DIAGNOSIS — Z96652 Presence of left artificial knee joint: Secondary | ICD-10-CM | POA: Insufficient documentation

## 2024-07-31 DIAGNOSIS — Z01818 Encounter for other preprocedural examination: Secondary | ICD-10-CM | POA: Diagnosis present

## 2024-07-31 DIAGNOSIS — M25562 Pain in left knee: Secondary | ICD-10-CM | POA: Diagnosis not present

## 2024-07-31 DIAGNOSIS — R001 Bradycardia, unspecified: Secondary | ICD-10-CM | POA: Diagnosis not present

## 2024-07-31 HISTORY — DX: Pneumonia, unspecified organism: J18.9

## 2024-07-31 HISTORY — DX: Myoneural disorder, unspecified: G70.9

## 2024-07-31 LAB — COMPREHENSIVE METABOLIC PANEL WITH GFR
ALT: 19 U/L (ref 0–44)
AST: 18 U/L (ref 15–41)
Albumin: 4 g/dL (ref 3.5–5.0)
Alkaline Phosphatase: 58 U/L (ref 38–126)
Anion gap: 9 (ref 5–15)
BUN: 21 mg/dL (ref 8–23)
CO2: 27 mmol/L (ref 22–32)
Calcium: 9.6 mg/dL (ref 8.9–10.3)
Chloride: 103 mmol/L (ref 98–111)
Creatinine, Ser: 0.77 mg/dL (ref 0.44–1.00)
GFR, Estimated: >60 mL/min (ref >60)
Glucose, Bld: 92 mg/dL (ref 70–99)
Potassium: 4.4 mmol/L (ref 3.5–5.1)
Sodium: 139 mmol/L (ref 135–145)
Total Bilirubin: 0.4 mg/dL (ref 0.0–1.2)
Total Protein: 6.7 g/dL (ref 6.5–8.1)

## 2024-07-31 LAB — CBC WITH DIFFERENTIAL/PLATELET
Abs Immature Granulocytes: 0.01 K/uL (ref 0.00–0.07)
Basophils Absolute: 0.1 K/uL (ref 0.0–0.1)
Basophils Relative: 1 %
Eosinophils Absolute: 0.2 K/uL (ref 0.0–0.5)
Eosinophils Relative: 3 %
HCT: 41.7 % (ref 36.0–46.0)
Hemoglobin: 13.1 g/dL (ref 12.0–15.0)
Immature Granulocytes: 0 %
Lymphocytes Relative: 24 %
Lymphs Abs: 1.6 K/uL (ref 0.7–4.0)
MCH: 29.2 pg (ref 26.0–34.0)
MCHC: 31.4 g/dL (ref 30.0–36.0)
MCV: 92.9 fL (ref 80.0–100.0)
Monocytes Absolute: 0.6 K/uL (ref 0.1–1.0)
Monocytes Relative: 9 %
Neutro Abs: 4.1 K/uL (ref 1.7–7.7)
Neutrophils Relative %: 63 %
Platelets: 226 K/uL (ref 150–400)
RBC: 4.49 MIL/uL (ref 3.87–5.11)
RDW: 13.2 % (ref 11.5–15.5)
WBC: 6.6 K/uL (ref 4.0–10.5)
nRBC: 0 % (ref 0.0–0.2)

## 2024-07-31 LAB — SURGICAL PCR SCREEN
MRSA, PCR: NEGATIVE
Staphylococcus aureus: NEGATIVE

## 2024-08-04 NOTE — Progress Notes (Signed)
 Phone call to pt this morning regarding surgery time change. Pt verbalizes understanding.  NPO (solids) after MN, Clear liquids until 0400 Arrive at [facility] Main entrance at 0515.

## 2024-08-05 NOTE — Anesthesia Preprocedure Evaluation (Signed)
 Anesthesia Evaluation  Patient identified by MRN, date of birth, ID band Patient awake    Reviewed: Allergy & Precautions, NPO status , Patient's Chart, lab work & pertinent test results  History of Anesthesia Complications (+) PONV and history of anesthetic complications  Airway Mallampati: II  TM Distance: >3 FB Neck ROM: Full    Dental  (+) Dental Advisory Given, Teeth Intact   Pulmonary former smoker   Pulmonary exam normal breath sounds clear to auscultation       Cardiovascular hypertension, Normal cardiovascular exam Rhythm:Regular Rate:Normal     Neuro/Psych negative neurological ROS  negative psych ROS   GI/Hepatic Neg liver ROS,GERD  ,,  Endo/Other  Hypothyroidism    Renal/GU negative Renal ROS     Musculoskeletal  (+) Arthritis , Osteoarthritis,    Abdominal   Peds  Hematology negative hematology ROS (+)   Anesthesia Other Findings   Reproductive/Obstetrics                              Anesthesia Physical Anesthesia Plan  ASA: 2  Anesthesia Plan: Spinal and Regional   Post-op Pain Management: Tylenol  PO (pre-op)* and Regional block*   Induction:   PONV Risk Score and Plan: 3 and Treatment may vary due to age or medical condition, Propofol  infusion, TIVA and Ondansetron   Airway Management Planned: Natural Airway  Additional Equipment:   Intra-op Plan:   Post-operative Plan:   Informed Consent: I have reviewed the patients History and Physical, chart, labs and discussed the procedure including the risks, benefits and alternatives for the proposed anesthesia with the patient or authorized representative who has indicated his/her understanding and acceptance.     Dental advisory given  Plan Discussed with: CRNA  Anesthesia Plan Comments:          Anesthesia Quick Evaluation

## 2024-08-06 ENCOUNTER — Inpatient Hospital Stay (HOSPITAL_COMMUNITY)
Admission: RE | Admit: 2024-08-06 | Discharge: 2024-08-07 | DRG: 489 | Disposition: A | Discharge status: Home Health | Attending: Orthopedic Surgery | Admitting: Orthopedic Surgery

## 2024-08-06 ENCOUNTER — Inpatient Hospital Stay (HOSPITAL_COMMUNITY): Payer: Self-pay | Admitting: Physician Assistant

## 2024-08-06 ENCOUNTER — Inpatient Hospital Stay (HOSPITAL_COMMUNITY): Payer: Self-pay | Admitting: Anesthesiology

## 2024-08-06 ENCOUNTER — Encounter (HOSPITAL_COMMUNITY): Payer: Self-pay | Admitting: Orthopedic Surgery

## 2024-08-06 ENCOUNTER — Inpatient Hospital Stay (HOSPITAL_COMMUNITY)

## 2024-08-06 ENCOUNTER — Encounter (HOSPITAL_COMMUNITY)
Admission: RE | Disposition: A | Discharge status: Home Health | Payer: Self-pay | Source: Home / Self Care | Attending: Orthopedic Surgery

## 2024-08-06 ENCOUNTER — Other Ambulatory Visit: Payer: Self-pay

## 2024-08-06 DIAGNOSIS — Y792 Prosthetic and other implants, materials and accessory orthopedic devices associated with adverse incidents: Secondary | ICD-10-CM | POA: Diagnosis present

## 2024-08-06 DIAGNOSIS — Z79899 Other long term (current) drug therapy: Secondary | ICD-10-CM | POA: Diagnosis not present

## 2024-08-06 DIAGNOSIS — I10 Essential (primary) hypertension: Secondary | ICD-10-CM | POA: Diagnosis present

## 2024-08-06 DIAGNOSIS — Z87891 Personal history of nicotine dependence: Secondary | ICD-10-CM | POA: Diagnosis not present

## 2024-08-06 DIAGNOSIS — Z96641 Presence of right artificial hip joint: Secondary | ICD-10-CM | POA: Diagnosis present

## 2024-08-06 DIAGNOSIS — Z885 Allergy status to narcotic agent status: Secondary | ICD-10-CM | POA: Diagnosis not present

## 2024-08-06 DIAGNOSIS — Z79890 Hormone replacement therapy (postmenopausal): Secondary | ICD-10-CM

## 2024-08-06 DIAGNOSIS — T84022A Instability of internal right knee prosthesis, initial encounter: Secondary | ICD-10-CM | POA: Diagnosis not present

## 2024-08-06 DIAGNOSIS — M25562 Pain in left knee: Secondary | ICD-10-CM | POA: Diagnosis present

## 2024-08-06 DIAGNOSIS — E890 Postprocedural hypothyroidism: Secondary | ICD-10-CM | POA: Diagnosis present

## 2024-08-06 DIAGNOSIS — Z96659 Presence of unspecified artificial knee joint: Principal | ICD-10-CM

## 2024-08-06 DIAGNOSIS — T84018A Broken internal joint prosthesis, other site, initial encounter: Principal | ICD-10-CM

## 2024-08-06 DIAGNOSIS — K219 Gastro-esophageal reflux disease without esophagitis: Secondary | ICD-10-CM | POA: Diagnosis present

## 2024-08-06 DIAGNOSIS — T84023A Instability of internal left knee prosthesis, initial encounter: Principal | ICD-10-CM | POA: Diagnosis present

## 2024-08-06 DIAGNOSIS — Z90710 Acquired absence of both cervix and uterus: Secondary | ICD-10-CM

## 2024-08-06 DIAGNOSIS — Z8249 Family history of ischemic heart disease and other diseases of the circulatory system: Secondary | ICD-10-CM

## 2024-08-06 DIAGNOSIS — Z886 Allergy status to analgesic agent status: Secondary | ICD-10-CM

## 2024-08-06 HISTORY — PX: REIMPLANTATION OF TOTAL KNEE: SHX6052

## 2024-08-06 LAB — TYPE AND SCREEN
ABO/RH(D): A POS
Antibody Screen: NEGATIVE

## 2024-08-06 SURGERY — REVISION, TOTAL ARTHROPLASTY, KNEE
Anesthesia: Regional | Site: Knee | Laterality: Left

## 2024-08-06 MED ORDER — ESCITALOPRAM OXALATE 10 MG PO TABS
10.0000 mg | ORAL_TABLET | Freq: Every morning | ORAL | Status: DC
  Administered 2024-08-07: 10 mg via ORAL
  Filled 2024-08-06: qty 1

## 2024-08-06 MED ORDER — OXYCODONE HCL 5 MG PO TABS: 5.0000 mg | ORAL_TABLET | ORAL | 0 refills | Status: AC | PRN

## 2024-08-06 MED ORDER — ACETAMINOPHEN 500 MG PO TABS
1000.0000 mg | ORAL_TABLET | Freq: Once | ORAL | Status: AC
  Administered 2024-08-06: 1000 mg via ORAL
  Filled 2024-08-06: qty 2

## 2024-08-06 MED ORDER — SODIUM CHLORIDE 0.9 % IV SOLN: INTRAVENOUS | Status: DC

## 2024-08-06 MED ORDER — POVIDONE-IODINE 10 % EX SWAB: 2.0000 | Freq: Once | CUTANEOUS | Status: DC

## 2024-08-06 MED ORDER — PHENYLEPHRINE 80 MCG/ML (10ML) SYRINGE FOR IV PUSH (FOR BLOOD PRESSURE SUPPORT): PREFILLED_SYRINGE | INTRAVENOUS | Status: DC | PRN

## 2024-08-06 MED ORDER — FENTANYL CITRATE (PF) 100 MCG/2ML IJ SOLN
INTRAMUSCULAR | Status: AC
  Filled 2024-08-06: qty 2

## 2024-08-06 MED ORDER — CHLORHEXIDINE GLUCONATE 0.12 % MT SOLN
15.0000 mL | Freq: Once | OROMUCOSAL | Status: AC
  Administered 2024-08-06: 15 mL via OROMUCOSAL

## 2024-08-06 MED ORDER — GABAPENTIN 300 MG PO CAPS
600.0000 mg | ORAL_CAPSULE | Freq: Three times a day (TID) | ORAL | Status: DC
  Administered 2024-08-06 – 2024-08-07 (×3): 600 mg via ORAL
  Filled 2024-08-06 (×3): qty 2

## 2024-08-06 MED ORDER — SODIUM CHLORIDE 0.9 % IR SOLN
Status: DC | PRN
Start: 2024-08-06 — End: 2024-08-06
  Administered 2024-08-06: 3000 mL

## 2024-08-06 MED ORDER — PROPOFOL 1000 MG/100ML IV EMUL
INTRAVENOUS | Status: AC
  Filled 2024-08-06: qty 100

## 2024-08-06 MED ORDER — LEVOTHYROXINE SODIUM 137 MCG PO TABS
137.0000 ug | ORAL_TABLET | Freq: Every day | ORAL | Status: DC
  Administered 2024-08-07: 137 ug via ORAL
  Filled 2024-08-06: qty 1

## 2024-08-06 MED ORDER — DEXAMETHASONE SOD PHOSPHATE PF 10 MG/ML IJ SOLN
8.0000 mg | Freq: Once | INTRAMUSCULAR | Status: AC
Start: 2024-08-06 — End: 2024-08-06
  Administered 2024-08-06: 5 mg via INTRAVENOUS

## 2024-08-06 MED ORDER — LACTATED RINGERS IV SOLN
INTRAVENOUS | Status: DC
Start: 2024-08-06 — End: 2024-08-06

## 2024-08-06 MED ORDER — KETOROLAC TROMETHAMINE 15 MG/ML IJ SOLN
7.5000 mg | Freq: Four times a day (QID) | INTRAMUSCULAR | Status: AC
  Administered 2024-08-06 – 2024-08-07 (×4): 7.5 mg via INTRAVENOUS
  Filled 2024-08-06 (×4): qty 1

## 2024-08-06 MED ORDER — ISOPROPYL ALCOHOL 70 % SOLN
Status: AC
  Filled 2024-08-06: qty 480

## 2024-08-06 MED ORDER — TRANEXAMIC ACID-NACL 1000-0.7 MG/100ML-% IV SOLN
1000.0000 mg | INTRAVENOUS | Status: AC
Start: 2024-08-06 — End: 2024-08-06
  Administered 2024-08-06: 1000 mg via INTRAVENOUS
  Filled 2024-08-06: qty 100

## 2024-08-06 MED ORDER — BUPIVACAINE-EPINEPHRINE (PF) 0.25% -1:200000 IJ SOLN
INTRAMUSCULAR | Status: AC
  Filled 2024-08-06: qty 30

## 2024-08-06 MED ORDER — CEFAZOLIN SODIUM-DEXTROSE 2-4 GM/100ML-% IV SOLN
2.0000 g | INTRAVENOUS | Status: AC
  Administered 2024-08-06: 2 g via INTRAVENOUS
  Filled 2024-08-06: qty 100

## 2024-08-06 MED ORDER — PHENYLEPHRINE HCL-NACL 20-0.9 MG/250ML-% IV SOLN
INTRAVENOUS | Status: AC
  Filled 2024-08-06: qty 250

## 2024-08-06 MED ORDER — OXYCODONE HCL 5 MG PO TABS
5.0000 mg | ORAL_TABLET | ORAL | Status: DC | PRN
  Administered 2024-08-07: 5 mg via ORAL
  Filled 2024-08-06: qty 1

## 2024-08-06 MED ORDER — PANTOPRAZOLE SODIUM 40 MG PO TBEC
40.0000 mg | DELAYED_RELEASE_TABLET | Freq: Every day | ORAL | Status: DC
  Administered 2024-08-06 – 2024-08-07 (×2): 40 mg via ORAL
  Filled 2024-08-06 (×2): qty 1

## 2024-08-06 MED ORDER — HYDROMORPHONE HCL 1 MG/ML IJ SOLN: 0.5000 mg | INTRAMUSCULAR | Status: DC | PRN

## 2024-08-06 MED ORDER — LOSARTAN POTASSIUM-HCTZ 50-12.5 MG PO TABS: 1.0000 | ORAL_TABLET | Freq: Every morning | ORAL | Status: DC

## 2024-08-06 MED ORDER — ONDANSETRON HCL 4 MG/2ML IJ SOLN
INTRAMUSCULAR | Status: AC
  Filled 2024-08-06: qty 2

## 2024-08-06 MED ORDER — ONDANSETRON HCL 4 MG PO TABS: 4.0000 mg | ORAL_TABLET | Freq: Three times a day (TID) | ORAL | 0 refills | Status: AC | PRN

## 2024-08-06 MED ORDER — DOCUSATE SODIUM 100 MG PO CAPS
100.0000 mg | ORAL_CAPSULE | Freq: Two times a day (BID) | ORAL | Status: DC
  Administered 2024-08-06 – 2024-08-07 (×3): 100 mg via ORAL
  Filled 2024-08-06 (×3): qty 1

## 2024-08-06 MED ORDER — PHENYLEPHRINE HCL-NACL 20-0.9 MG/250ML-% IV SOLN
INTRAVENOUS | Status: DC | PRN
  Administered 2024-08-06: 40 ug/min via INTRAVENOUS

## 2024-08-06 MED ORDER — POLYETHYLENE GLYCOL 3350 17 G PO PACK: 17.0000 g | PACK | Freq: Every day | ORAL | Status: DC | PRN

## 2024-08-06 MED ORDER — EPHEDRINE 5 MG/ML INJ
INTRAVENOUS | Status: AC
  Filled 2024-08-06: qty 5

## 2024-08-06 MED ORDER — CEFAZOLIN SODIUM-DEXTROSE 2-4 GM/100ML-% IV SOLN
2.0000 g | Freq: Three times a day (TID) | INTRAVENOUS | Status: DC
  Administered 2024-08-06 – 2024-08-07 (×3): 2 g via INTRAVENOUS
  Filled 2024-08-06 (×3): qty 100

## 2024-08-06 MED ORDER — BUPIVACAINE LIPOSOME 1.3 % IJ SUSP
INTRAMUSCULAR | Status: AC
  Filled 2024-08-06: qty 20

## 2024-08-06 MED ORDER — LOSARTAN POTASSIUM 50 MG PO TABS
50.0000 mg | ORAL_TABLET | Freq: Every morning | ORAL | Status: DC
  Administered 2024-08-07: 50 mg via ORAL
  Filled 2024-08-06 (×2): qty 1

## 2024-08-06 MED ORDER — MENTHOL 3 MG MT LOZG: 1.0000 | LOZENGE | OROMUCOSAL | Status: DC | PRN

## 2024-08-06 MED ORDER — MAGNESIUM GLUCONATE 500 (27 MG) MG PO TABS
500.0000 mg | ORAL_TABLET | ORAL | Status: DC
  Filled 2024-08-06: qty 1

## 2024-08-06 MED ORDER — HYDROCHLOROTHIAZIDE 12.5 MG PO TABS
12.5000 mg | ORAL_TABLET | Freq: Every morning | ORAL | Status: DC
  Administered 2024-08-06 – 2024-08-07 (×2): 12.5 mg via ORAL
  Filled 2024-08-06 (×2): qty 1

## 2024-08-06 MED ORDER — METHOCARBAMOL 1000 MG/10ML IJ SOLN: 500.0000 mg | Freq: Four times a day (QID) | INTRAMUSCULAR | Status: DC | PRN

## 2024-08-06 MED ORDER — LACTATED RINGERS IV SOLN: INTRAVENOUS | Status: DC

## 2024-08-06 MED ORDER — ACETAMINOPHEN 500 MG PO TABS: 1000.0000 mg | ORAL_TABLET | Freq: Once | ORAL | Status: DC

## 2024-08-06 MED ORDER — POLYETHYLENE GLYCOL 3350 17 G PO PACK: 17.0000 g | PACK | Freq: Every day | ORAL | 0 refills | Status: DC

## 2024-08-06 MED ORDER — FENTANYL CITRATE (PF) 100 MCG/2ML IJ SOLN
INTRAMUSCULAR | Status: DC | PRN
  Administered 2024-08-06 (×2): 50 ug via INTRAVENOUS

## 2024-08-06 MED ORDER — BUPIVACAINE LIPOSOME 1.3 % IJ SUSP: 20.0000 mL | Freq: Once | INTRAMUSCULAR | Status: DC

## 2024-08-06 MED ORDER — NIFEDIPINE ER OSMOTIC RELEASE 30 MG PO TB24
30.0000 mg | ORAL_TABLET | Freq: Every morning | ORAL | Status: DC
Start: 2024-08-07 — End: 2024-08-07
  Administered 2024-08-07: 30 mg via ORAL
  Filled 2024-08-06: qty 1

## 2024-08-06 MED ORDER — DIPHENHYDRAMINE HCL 12.5 MG/5ML PO ELIX: 12.5000 mg | ORAL_SOLUTION | ORAL | Status: DC | PRN

## 2024-08-06 MED ORDER — ONDANSETRON HCL 4 MG PO TABS: 4.0000 mg | ORAL_TABLET | Freq: Four times a day (QID) | ORAL | Status: DC | PRN

## 2024-08-06 MED ORDER — DROPERIDOL 2.5 MG/ML IJ SOLN: 0.6250 mg | Freq: Once | INTRAMUSCULAR | Status: DC | PRN

## 2024-08-06 MED ORDER — ACETAMINOPHEN 500 MG PO TABS: 1000.0000 mg | ORAL_TABLET | Freq: Three times a day (TID) | ORAL | Status: AC | PRN

## 2024-08-06 MED ORDER — PROPOFOL 500 MG/50ML IV EMUL
INTRAVENOUS | Status: DC | PRN
  Administered 2024-08-06: 10 mg via INTRAVENOUS
  Administered 2024-08-06: 20 mg via INTRAVENOUS
  Administered 2024-08-06: 50 ug/kg/min via INTRAVENOUS
  Administered 2024-08-06: 30 mg via INTRAVENOUS

## 2024-08-06 MED ORDER — PHENOL 1.4 % MT LIQD: 1.0000 | OROMUCOSAL | Status: DC | PRN

## 2024-08-06 MED ORDER — FESOTERODINE FUMARATE ER 8 MG PO TB24
8.0000 mg | ORAL_TABLET | Freq: Every morning | ORAL | Status: DC
Start: 2024-08-07 — End: 2024-08-07
  Administered 2024-08-07: 8 mg via ORAL
  Filled 2024-08-06: qty 1

## 2024-08-06 MED ORDER — METHOCARBAMOL 500 MG PO TABS: 500.0000 mg | ORAL_TABLET | Freq: Three times a day (TID) | ORAL | 0 refills | Status: AC | PRN

## 2024-08-06 MED ORDER — ASPIRIN 81 MG PO CHEW
81.0000 mg | CHEWABLE_TABLET | Freq: Two times a day (BID) | ORAL | Status: DC
  Administered 2024-08-06 – 2024-08-07 (×2): 81 mg via ORAL
  Filled 2024-08-06 (×2): qty 1

## 2024-08-06 MED ORDER — EPHEDRINE SULFATE-NACL 50-0.9 MG/10ML-% IV SOSY
PREFILLED_SYRINGE | INTRAVENOUS | Status: DC | PRN
  Administered 2024-08-06 (×2): 5 mg via INTRAVENOUS

## 2024-08-06 MED ORDER — ASPIRIN 81 MG PO TBEC: 81.0000 mg | DELAYED_RELEASE_TABLET | Freq: Two times a day (BID) | ORAL | Status: AC

## 2024-08-06 MED ORDER — ISOPROPYL ALCOHOL 70 % SOLN
Status: DC | PRN
  Administered 2024-08-06: 1 via TOPICAL

## 2024-08-06 MED ORDER — SODIUM CHLORIDE (PF) 0.9 % IJ SOLN
INTRAMUSCULAR | Status: DC | PRN
  Administered 2024-08-06: 80 mL

## 2024-08-06 MED ORDER — METHOCARBAMOL 500 MG PO TABS
500.0000 mg | ORAL_TABLET | Freq: Four times a day (QID) | ORAL | Status: DC | PRN
  Administered 2024-08-06 – 2024-08-07 (×2): 500 mg via ORAL
  Filled 2024-08-06 (×2): qty 1

## 2024-08-06 MED ORDER — ONDANSETRON HCL 4 MG/2ML IJ SOLN
INTRAMUSCULAR | Status: DC | PRN
  Administered 2024-08-06: 4 mg via INTRAVENOUS

## 2024-08-06 MED ORDER — SODIUM CHLORIDE (PF) 0.9 % IJ SOLN
INTRAMUSCULAR | Status: AC
  Filled 2024-08-06: qty 30

## 2024-08-06 MED ORDER — ORAL CARE MOUTH RINSE: 15.0000 mL | Freq: Once | OROMUCOSAL | Status: AC

## 2024-08-06 MED ORDER — 0.9 % SODIUM CHLORIDE (POUR BTL) OPTIME
TOPICAL | Status: DC | PRN
  Administered 2024-08-06: 1000 mL

## 2024-08-06 MED ORDER — ACETAMINOPHEN 325 MG PO TABS: 325.0000 mg | ORAL_TABLET | Freq: Four times a day (QID) | ORAL | Status: DC | PRN

## 2024-08-06 MED ORDER — WATER FOR IRRIGATION, STERILE IR SOLN
Status: DC | PRN
  Administered 2024-08-06: 2000 mL

## 2024-08-06 MED ORDER — HYDROMORPHONE HCL 1 MG/ML IJ SOLN: 0.2500 mg | INTRAMUSCULAR | Status: DC | PRN

## 2024-08-06 MED ORDER — ONDANSETRON HCL 4 MG/2ML IJ SOLN: 4.0000 mg | Freq: Four times a day (QID) | INTRAMUSCULAR | Status: DC | PRN

## 2024-08-06 MED ORDER — ACETAMINOPHEN 500 MG PO TABS
1000.0000 mg | ORAL_TABLET | Freq: Four times a day (QID) | ORAL | Status: AC
  Administered 2024-08-06 – 2024-08-07 (×4): 1000 mg via ORAL
  Filled 2024-08-06 (×5): qty 2

## 2024-08-06 MED ORDER — ZOLPIDEM TARTRATE 5 MG PO TABS
5.0000 mg | ORAL_TABLET | Freq: Every evening | ORAL | Status: DC | PRN
  Administered 2024-08-06: 5 mg via ORAL
  Filled 2024-08-06: qty 1

## 2024-08-06 SURGICAL SUPPLY — 49 items
ATTUNE PSRP INSR SZ6 8 KNEE (Insert) IMPLANT
BAG COUNTER SPONGE SURGICOUNT (BAG) IMPLANT
BLADE SAG 18X100X1.27 (BLADE) ×1 IMPLANT
BLADE SAW SAG 35X64 .89 (BLADE) ×1 IMPLANT
BLADE SAW SGTL 81X20 HD (BLADE) IMPLANT
BNDG COHESIVE 4X5 TAN STRL LF (GAUZE/BANDAGES/DRESSINGS) ×1 IMPLANT
BNDG ELASTIC 6X10 VLCR STRL LF (GAUZE/BANDAGES/DRESSINGS) ×1 IMPLANT
BOWL SMART MIX CTS (DISPOSABLE) IMPLANT
CANISTER WOUND CARE 500ML ATS (WOUND CARE) ×1 IMPLANT
CHLORAPREP W/TINT 26 (MISCELLANEOUS) ×2 IMPLANT
CNTNR URN SCR LID CUP LEK RST (MISCELLANEOUS) IMPLANT
COVER SURGICAL LIGHT HANDLE (MISCELLANEOUS) ×1 IMPLANT
CUFF TRNQT CYL 34X4.125X (TOURNIQUET CUFF) ×1 IMPLANT
DRAPE INCISE IOBAN 85X60 (DRAPES) ×1 IMPLANT
DRAPE SHEET LG 3/4 BI-LAMINATE (DRAPES) ×1 IMPLANT
DRAPE U-SHAPE 47X51 STRL (DRAPES) ×1 IMPLANT
DRESSING PEEL AND PLAC PRVNA20 (GAUZE/BANDAGES/DRESSINGS) ×1 IMPLANT
ELECT REM PT RETURN 15FT ADLT (MISCELLANEOUS) ×1 IMPLANT
GAUZE SPONGE 4X4 12PLY STRL (GAUZE/BANDAGES/DRESSINGS) ×1 IMPLANT
GLOVE BIO SURGEON STRL SZ 6.5 (GLOVE) ×2 IMPLANT
GLOVE BIOGEL PI IND STRL 6.5 (GLOVE) ×1 IMPLANT
GLOVE BIOGEL PI IND STRL 8 (GLOVE) ×1 IMPLANT
GLOVE SURG ORTHO 8.0 STRL STRW (GLOVE) ×2 IMPLANT
GOWN STRL REUS W/ TWL XL LVL3 (GOWN DISPOSABLE) ×2 IMPLANT
HOLDER FOLEY CATH W/STRAP (MISCELLANEOUS) IMPLANT
HOOD PEEL AWAY T7 (MISCELLANEOUS) ×3 IMPLANT
KIT DRSG PREVENA PLUS 7DAY 125 (MISCELLANEOUS) ×1 IMPLANT
KIT TURNOVER KIT A (KITS) ×1 IMPLANT
MANIFOLD NEPTUNE II (INSTRUMENTS) ×1 IMPLANT
MARKER SKIN DUAL TIP RULER LAB (MISCELLANEOUS) ×1 IMPLANT
NS IRRIG 1000ML POUR BTL (IV SOLUTION) ×1 IMPLANT
PACK TOTAL KNEE CUSTOM (KITS) ×1 IMPLANT
PENCIL SMOKE EVACUATOR (MISCELLANEOUS) ×1 IMPLANT
PROTECTOR NERVE ULNAR (MISCELLANEOUS) ×1 IMPLANT
SET HNDPC FAN SPRY TIP SCT (DISPOSABLE) ×1 IMPLANT
SOLUTION IRRIG SURGIPHOR (IV SOLUTION) IMPLANT
SOLUTION PRONTOSAN WOUND 350ML (IRRIGATION / IRRIGATOR) IMPLANT
SPIKE FLUID TRANSFER (MISCELLANEOUS) ×1 IMPLANT
SUT ETHILON 3 0 PS 1 (SUTURE) ×4 IMPLANT
SUT NYLON 3 0 (SUTURE) IMPLANT
SUT STRATAFIX 14 PDO 48 VLT (SUTURE) ×1 IMPLANT
SUT VIC AB 0 CT1 36 (SUTURE) ×1 IMPLANT
SUT VIC AB 1 CT1 36 (SUTURE) IMPLANT
SUT VIC AB 2-0 CT2 27 (SUTURE) ×2 IMPLANT
SUTURE STRATFX 0 PDS 27 VIOLET (SUTURE) ×1 IMPLANT
SYR 50ML LL SCALE MARK (SYRINGE) ×1 IMPLANT
TRAY FOLEY MTR SLVR 16FR STAT (SET/KITS/TRAYS/PACK) ×1 IMPLANT
TUBE SUCTION HIGH CAP CLEAR NV (SUCTIONS) ×1 IMPLANT
UNDERPAD 30X36 HEAVY ABSORB (UNDERPADS AND DIAPERS) ×1 IMPLANT

## 2024-08-06 NOTE — Discharge Instructions (Signed)

## 2024-08-06 NOTE — Progress Notes (Signed)
 Orthopedic Tech Progress Note Patient Details:  Jackie Cooke [birth year] [id number] Applied bone foam per order.  Ortho Devices Type of Ortho Device: Bone foam zero knee Ortho Device/Splint Location: LLE Ortho Device/Splint Interventions: Ordered, Application, Adjustment   Post Interventions Patient Tolerated: Well Instructions Provided: Adjustment of device, Care of device, Poper ambulation with device  Morna Pink 08/06/2024, 11:18 AM

## 2024-08-06 NOTE — Transfer of Care (Signed)
 Immediate Anesthesia Transfer of Care Note  Patient: Jackie Cooke  Procedure(s) Performed: Procedure(s): REVISION, TOTAL ARTHROPLASTY, KNEE (Left)  Patient Location: PACU  Anesthesia Type:MAC, Regional, and Spinal  Level of Consciousness: Patient easily awoken, comfortable, cooperative, following commands, responds to stimulation.   Airway & Oxygen Therapy: Patient spontaneously breathing, ventilating well, oxygen via simple oxygen mask.  Post-op Assessment: Report given to PACU RN, vital signs reviewed and stable.   Post vital signs: Reviewed and stable.  Complications: No apparent anesthesia complications  Last Vitals:  Vitals Value Taken Time  BP 100/58 08/06/24 09:19  Temp    Pulse 72 08/06/24 09:21  Resp 11 08/06/24 09:21  SpO2 95 % 08/06/24 09:21  Vitals shown include unfiled device data.  Last Pain:  Vitals:   08/06/24 0542  TempSrc: Oral  PainSc: 3       Patients Stated Pain Goal: 5 (08/06/24 0542)  Complications: No notable events documented.

## 2024-08-06 NOTE — Anesthesia Procedure Notes (Addendum)
 Anesthesia Regional Block: Adductor canal block   Pre-Anesthetic Checklist: , timeout performed,  Correct Patient, Correct Site, Correct Laterality,  Correct Procedure, Correct Position, site marked,  Risks and benefits discussed,  Surgical consent,  Pre-op evaluation,  At surgeon's request and post-op pain management  Laterality: Lower and Left  Prep: chloraprep       Needles:  Injection technique: Single-shot  Needle Type: Stimiplex     Needle Length: 9cm  Needle Gauge: 21     Additional Needles:   Procedures:,,,, ultrasound used (permanent image in chart),,    Narrative:  Start time: 08/06/2024 6:55 AM End time: 08/06/2024 7:15 AM Injection made incrementally with aspirations every 5 mL.  Performed by: Personally  Anesthesiologist: Darlyn Rush, MD  Additional Notes: BP cuff, EKG monitors applied. Sedation begun. Artery and nerve location verified with ultrasound. Anesthetic injected incrementally (5ml), slowly, and after negative aspirations under direct u/s guidance. Good fascial/perineural spread. Tolerated well.

## 2024-08-06 NOTE — Interval H&P Note (Signed)
 The patient has been re-examined, and the chart reviewed, and there have been no interval changes to the documented history and physical.    Plan for L TKA revision liner exchange for instability  The operative side was examined and the patient was confirmed to have sensation to DPN, SPN, TN intact, Motor EHL, ext, flex 5/5, and DP 2+, PT 2+, No significant edema.   The risks, benefits, and alternatives have been discussed at length with patient, and the patient is willing to proceed.  Left knee marked. Consent has been signed.

## 2024-08-06 NOTE — Anesthesia Procedure Notes (Signed)
 Procedure Name: MAC Date/Time: 08/06/2024 7:34 AM  Performed by: Joshua Vernell BROCKS, CRNAPre-anesthesia Checklist: Patient identified, Emergency Drugs available, Suction available and Patient being monitored Patient Re-evaluated:Patient Re-evaluated prior to induction Oxygen Delivery Method: Simple face mask Preoxygenation: Pre-oxygenation with 100% oxygen Placement Confirmation: positive ETCO2 and breath sounds checked- equal and bilateral Dental Injury: Teeth and Oropharynx as per pre-operative assessment

## 2024-08-06 NOTE — Evaluation (Signed)
 Physical Therapy Evaluation Patient Details Name: Jackie Cooke MRN: [id number] DOB: 1950-12-13 Today's Date: 08/06/2024  History of Present Illness  73 yo female s/p Revision L TKA  synovectomy and liner exchange on 08/06/24. PMH:  bil TKAs, HTN, R THA  Clinical Impression  Pt is s/p TKA resulting in the deficits listed below (see PT Problem List).  Pt amb 30' with RW and min/CGA, no incr knee pain with activity however with c/o back pain which worsened with mobility. Provided with hot packs to LB.  Pt motivated to be OOB however may have low threshold for sitting d/t back pain. RN aware. Anticipate steady progress in acute setting  Pt will benefit from acute skilled PT to increase their independence and safety with mobility to allow discharge.          If plan is discharge home, recommend the following: A little help with walking and/or transfers;A little help with bathing/dressing/bathroom;Help with stairs or ramp for entrance;Assist for transportation   Can travel by private vehicle        Equipment Recommendations None recommended by PT  Recommendations for Other Services       Functional Status Assessment Patient has had a recent decline in their functional status and demonstrates the ability to make significant improvements in function in a reasonable and predictable amount of time.     Precautions / Restrictions Precautions Precautions: Knee;Fall Restrictions Weight Bearing Restrictions Per Provider Order: No Other Position/Activity Restrictions: wbat      Mobility  Bed Mobility Overal bed mobility: Needs Assistance Bed Mobility: Supine to Sit     Supine to sit: Supervision     General bed mobility comments: for lines and safety    Transfers Overall transfer level: Needs assistance Equipment used: Rolling walker (2 wheels) Transfers: Sit to/from Stand Sit to Stand: Contact guard assist           General transfer comment: verbal cues for hand  placement and LLE postion    Ambulation/Gait Ambulation/Gait assistance: Contact guard assist, Min assist Gait Distance (Feet): 30 Feet Assistive device: Rolling walker (2 wheels) Gait Pattern/deviations: Step-to pattern Gait velocity: decr     General Gait Details: cues for sequence and RW position  Stairs            Wheelchair Mobility     Tilt Bed    Modified Rankin (Stroke Patients Only)       Balance Overall balance assessment: No apparent balance deficits (not formally assessed)                                           Pertinent Vitals/Pain Pain Assessment Pain Assessment: Faces Faces Pain Scale: Hurts little more Pain Location: back with activity, L knee Pain Descriptors / Indicators: Burning, Aching Pain Intervention(s): Limited activity within patient's tolerance, Monitored during session, Premedicated before session, Repositioned, Patient requesting pain meds-RN notified, Heat applied (heat to low back)    Home Living Family/patient expects to be discharged to:: Private residence Living Arrangements: Alone Available Help at Discharge: Family;Available 24 hours/day Type of Home: House Home Access: Stairs to enter Entrance Stairs-Rails: Left Entrance Stairs-Number of Steps: 3   Home Layout: One level Home Equipment: Agricultural consultant (2 wheels);BSC/3in1 Additional Comments: pt's dtr will be assisting after surgery    Prior Function Prior Level of Function : Independent/Modified Independent  Mobility Comments: independent ADLs Comments: independent     Extremity/Trunk Assessment   Upper Extremity Assessment Upper Extremity Assessment: Overall WFL for tasks assessed    Lower Extremity Assessment Lower Extremity Assessment: LLE deficits/detail LLE Deficits / Details: ankle WFL, knee extension and hip flexion 3/5, limited by anticipated post op deficits       Communication        Cognition                                          Cueing       General Comments      Exercises     Assessment/Plan    PT Assessment Patient needs continued PT services  PT Problem List Decreased strength;Decreased range of motion;Decreased activity tolerance;Decreased balance;Decreased mobility;Decreased knowledge of use of DME;Pain;Decreased knowledge of precautions       PT Treatment Interventions DME instruction;Therapeutic exercise;Gait training;Functional mobility training;Therapeutic activities;Patient/family education;Stair training    PT Goals (Current goals can be found in the Care Plan section)  Acute Rehab PT Goals Patient Stated Goal: less back pain PT Goal Formulation: With patient Time For Goal Achievement: 08/13/24 Potential to Achieve Goals: Good    Frequency 7X/week     Co-evaluation               AM-PAC PT 6 Clicks Mobility  Outcome Measure Help needed turning from your back to your side while in a flat bed without using bedrails?: A Little Help needed moving from lying on your back to sitting on the side of a flat bed without using bedrails?: A Little Help needed moving to and from a bed to a chair (including a wheelchair)?: A Little Help needed standing up from a chair using your arms (e.g., wheelchair or bedside chair)?: A Little Help needed to walk in hospital room?: A Little Help needed climbing 3-5 steps with a railing? : A Little 6 Click Score: 18    End of Session Equipment Utilized During Treatment: Gait belt Activity Tolerance: Patient tolerated treatment well Patient left: in chair;with call bell/phone within reach;with chair alarm set Nurse Communication: Mobility status PT Visit Diagnosis: Other abnormalities of gait and mobility (R26.89);Difficulty in walking, not elsewhere classified (R26.2)    Time: 8566-8544 PT Time Calculation (min) (ACUTE ONLY): 22 min   Charges:   PT Evaluation $PT Eval Low Complexity: 1 Low   PT  General Charges $$ ACUTE PT VISIT: 1 Visit         Raquel Sayres, PT  Acute Rehab Dept [facility]) [phone]  08/06/2024   [facility] 08/06/2024, 3:03 PM

## 2024-08-06 NOTE — Anesthesia Postprocedure Evaluation (Signed)
 Anesthesia Post Note  Patient: STEPHAINE BRESHEARS  Procedure(s) Performed: REVISION, TOTAL ARTHROPLASTY, KNEE (Left: Knee)     Patient location during evaluation: PACU Anesthesia Type: Spinal Level of consciousness: awake and alert Pain management: pain level controlled Vital Signs Assessment: post-procedure vital signs reviewed and stable Respiratory status: spontaneous breathing Cardiovascular status: stable Anesthetic complications: no   No notable events documented.  Last Vitals:  Vitals:   08/06/24 1038 08/06/24 1231  BP: 116/63 110/62  Pulse: 69 69  Resp: 16 16  Temp: 36.5 C (!) 36.4 C  SpO2: 98% 96%    Last Pain:  Vitals:   08/06/24 1154  TempSrc:   PainSc: 3                  Norleen Pope

## 2024-08-06 NOTE — Op Note (Signed)
 DATE OF SURGERY:  08/06/2024 TIME: 8:40 AM  PATIENT NAME:  Jackie Cooke   AGE: 73 y.o.    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   OPERATIVE IMPLANTS:  DePuy attune 8 mm poly for a size 6 femur and tibia Implant Name Type Inv. Item Serial No. Manufacturer Lot No. LRB No. Used Action  [employer]UN[employer]SR[employer] INSR SZ6 [id number] KNEE - [id number] Insert ATTUNE [employer]SR[employer] INSR SZ6 8 KNEE  [employer] [id number] Left 1 Implanted      PREOPERATIVE INDICATIONS:  Jackie Cooke is a 73 y.o. year old female who had undergone left knee arthroplasty in 2019 with Dr. Jane.  She also had a right knee replaced around the same time.  In regards to the left knee she has had progressively worsening symptoms of instability.  Clinically the left knee felt notably looser especially compared to the her right knee and she had a little bit of hyperextension and hyperflexion.  Elected to revise her left knee with plan to perform a synovectomy and liner exchange  The risks, benefits, and alternatives were discussed at length including but not limited to the risks of infection, bleeding, nerve injury, stiffness, blood clots, the need for revision surgery, cardiopulmonary complications, among others, and they were willing to proceed.  OPERATIVE FINDINGS AND UNIQUE ASPECTS OF THE CASE: Relative laxity in both extension and flexion.  Notably there was some posterior wear on the poly likely related to hyperflexion.  ESTIMATED BLOOD LOSS: 50cc  OPERATIVE DESCRIPTION:   Once adequate anesthesia was induced, preoperative antibiotics, 2 gm of ancef ,1 gm of Tranexamic Acid , and 5 mg of  Decadron  administered, the patient was positioned supine with a left thigh tourniquet placed.  The left lower extremity was prepped and draped in sterile fashion.  A time-  out was performed identifying the patient, planned procedure, and the appropriate extremity.     The leg was  exsanguinated, tourniquet elevated to 250 mmHg.  A midline incision was  made utilizing the patient's old scar followed by median parapatellar arthrotomy.  A medial release was performed, the infrapatellar fat pad was resected with care taken to protect the patellar tendon.  No significant synovitis was appreciated.  A little bit of clear synovial fluid was noted.  1 synovial soft tissue specimen was sent for routine aerobic anaerobic culture.  Following initial  exposure, the old polyethylene insert was removed and examined.  There was no damage to the femoral and tibial components which appear to be well-fixed.  Synovectomy was also performed around the patella and small lateral patellar osteophyte was excised.  We then trialed different sizes for the polyethylene.  We trialed from the 6 mm up to the 8 mm and 10 mm.  8 mm had excellent stability full extension good range of motion.  The 10 mm was notably to tight with no significant plane with varus valgus stress or anterior posterior drawer.  Elected to insert the 8 mm poly to provide improved stability for the patient.  The knee was irrigated with sterile Betadine  diluted in saline as well as pulse lavage normal saline.  The synovial lining was  then injected a dilute Exparel  with 30cc of 0.25% marcaine  with epinephrine .  I confirmed that I was satisfied with the range of motion and stability, and the final 8 mm RP poly insert was chosen.  It was placed into the knee.      The tourniquet had been let down.  No significant hemostasis was required.  The medial parapatellar arthrotomy was then reapproximated using #1 Vicryl and #1 Stratafix sutures with the knee  in  flexion.  The remaining wound was closed with 0 stratafix, 2-0 Vicryl, and running 3-0 Monocryl. The knee was cleaned, dried, dressed sterilely using Dermabond and   Aquacel dressing.  The patient was then brought to recovery room in stable condition, tolerating the procedure  well. There were no complications.   Post op recs: WB: WBAT Abx: ancef  in house, discharged on cefadroxil Imaging: PACU xrays DVT prophylaxis: Aspirin  81mg  BID x4 weeks Follow up: 2 weeks after surgery for a wound check with Dr. Edna at [facility].  Address: [address], [place], [place] [address]  Office Phone: [phone]  Toribio Edna, MD Orthopaedic Surgery

## 2024-08-07 ENCOUNTER — Encounter (HOSPITAL_COMMUNITY): Payer: Self-pay | Admitting: Orthopedic Surgery

## 2024-08-07 LAB — CBC
HCT: 38 % (ref 36.0–46.0)
Hemoglobin: 11.7 g/dL — ABNORMAL LOW (ref 12.0–15.0)
MCH: 28.5 pg (ref 26.0–34.0)
MCHC: 30.8 g/dL (ref 30.0–36.0)
MCV: 92.5 fL (ref 80.0–100.0)
Platelets: 257 K/uL (ref 150–400)
RBC: 4.11 MIL/uL (ref 3.87–5.11)
RDW: 12.7 % (ref 11.5–15.5)
WBC: 7.7 K/uL (ref 4.0–10.5)
nRBC: 0 % (ref 0.0–0.2)

## 2024-08-07 LAB — BASIC METABOLIC PANEL WITH GFR
Anion gap: 10 (ref 5–15)
BUN: 16 mg/dL (ref 8–23)
CO2: 26 mmol/L (ref 22–32)
Calcium: 9.3 mg/dL (ref 8.9–10.3)
Chloride: 105 mmol/L (ref 98–111)
Creatinine, Ser: 0.7 mg/dL (ref 0.44–1.00)
GFR, Estimated: >60 mL/min (ref >60)
Glucose, Bld: 113 mg/dL — ABNORMAL HIGH (ref 70–99)
Potassium: 4.1 mmol/L (ref 3.5–5.1)
Sodium: 140 mmol/L (ref 135–145)

## 2024-08-07 NOTE — TOC Transition Note (Signed)
 Transition of Care [facility]) - Discharge Note   Patient Details  Name: Jackie Cooke MRN: [id number] Date of Birth: [birth year]  [facility] CM/SW Contact:  Alfonse JONELLE Rex, RN Phone Number: 08/07/2024, 10:52 AM   Clinical Narrative:   Met with patient at bedside to review dc therapy and home equipment needs, pt confirmed HH PT-[facility], has RW, no home DME needs. No TOC needs.     Final next level of care: Home w Home Health Services Barriers to Discharge: No Barriers Identified   Patient Goals and CMS Choice            Discharge Placement                       Discharge Plan and Services Additional resources added to the After Visit Summary for                                       Social Drivers of Health (SDOH) Interventions SDOH Screenings   Food Insecurity: No Food Insecurity (08/06/2024)  Housing: Low Risk  (08/06/2024)  Transportation Needs: No Transportation Needs (08/06/2024)  Utilities: Not At Risk (08/06/2024)  Financial Resource Strain: Low Risk  (07/20/2021)   Received from [facility] visits prior to 12/25/2022.  Physical Activity: Sufficiently Active (07/20/2021)   Received from [facility] visits prior to 12/25/2022.  Social Connections: Moderately Isolated (08/06/2024)  Stress: Stress Concern Present (07/20/2021)   Received from [facility] visits prior to 12/25/2022.  Tobacco Use: Medium Risk (08/06/2024)     Readmission Risk Interventions    08/07/2024   10:51 AM  Readmission Risk Prevention Plan  Post Dischage Appt Complete  Medication Screening Complete  Transportation Screening Complete

## 2024-08-07 NOTE — Discharge Summary (Signed)
 Physician Discharge Summary  Patient ID: Jackie Cooke MRN: [id number] DOB/AGE: [birth year] 73 y.o.  Admit date: 08/06/2024 Discharge date: 08/07/2024  Admission Diagnoses:  Failed total knee replacement  Discharge Diagnoses:  Principal Problem:   Failed total knee replacement   Past Medical History:  Diagnosis Date   Arthritis    Baker's cyst of knee, left 03/07/2018   Essential hypertension, benign 02/20/2018   Functional dyspepsia    GERD (gastroesophageal reflux disease)    at times takes over the counter meds    H. pylori infection    Hypertension    Hypothyroidism 02/20/2018   Neuromuscular disorder (HCC)    Small fiber neuropathy   Painful retained orthopaedic hardware right knee(HCC) 07/15/2018   Pneumonia    PONV (postoperative nausea and vomiting)    Primary localized osteoarthritis of left knee 02/20/2018   Primary localized osteoarthritis of right knee 07/15/2018   S/P total knee replacement, left 07/15/2018   Thyroid disease     Surgeries: Procedure(s): REVISION, TOTAL ARTHROPLASTY, KNEE on 08/06/2024   Consultants (if any):   Discharged Condition: Improved  Hospital Course: Jackie Cooke is an 73 y.o. female who was admitted 08/06/2024 with a diagnosis of Failed total knee replacement and went to the operating room on 08/06/2024 and underwent the above named procedures.    She was given perioperative antibiotics:  Anti-infectives (From admission, onward)    Start     Dose/Rate Route Frequency Ordered Stop   08/06/24 1500  ceFAZolin  (ANCEF ) IVPB 2g/100 mL premix        2 g 200 mL/hr over 30 Minutes Intravenous Every 8 hours 08/06/24 1022 08/09/24 1459   08/06/24 0600  ceFAZolin  (ANCEF ) IVPB 2g/100 mL premix        2 g 200 mL/hr over 30 Minutes Intravenous On call to O.R. 08/06/24 0522 08/06/24 9261     .  She was given sequential compression devices, early ambulation, and aspriin for DVT prophylaxis.  She benefited maximally from the  hospital stay and there were no complications.    Recent vital signs:  Vitals:   08/07/24 0152 08/07/24 0550  BP: (!) 152/80 (!) 134/96  Pulse: 67 65  Resp:  15  Temp:  97.9 F (36.6 C)  SpO2:  99%    Recent laboratory studies:  Lab Results  Component Value Date   HGB 11.7 (L) 08/07/2024   HGB 13.1 07/31/2024   HGB 10.5 (L) 09/06/2018   Lab Results  Component Value Date   WBC 7.7 08/07/2024   PLT 257 08/07/2024   Lab Results  Component Value Date   INR 1.06 08/23/2018   Lab Results  Component Value Date   NA 140 08/07/2024   K 4.1 08/07/2024   CL 105 08/07/2024   CO2 26 08/07/2024   BUN 16 08/07/2024   CREATININE 0.70 08/07/2024   GLUCOSE 113 (H) 08/07/2024    Discharge Medications:   Allergies as of 08/07/2024       Reactions   Morphine Anaphylaxis, Shortness Of Breath   Celebrex [celecoxib] Hives        Medication List     TAKE these medications    acetaminophen  500 MG tablet Commonly known as: TYLENOL  Take 2 tablets (1,000 mg total) by mouth every 8 (eight) hours as needed. What changed:  how much to take when to take this reasons to take this   aspirin  EC 81 MG tablet Take 1 tablet (81 mg total) by mouth 2 (two) times daily  for 28 days. Swallow whole.   Biotin 89999 MCG Tbdp Take 10,000 mcg by mouth in the morning.   CITRACAL PO Take 1 tablet by mouth in the morning.   escitalopram  10 MG tablet Commonly known as: LEXAPRO  Take 10 mg by mouth in the morning.   estradiol 1 MG tablet Commonly known as: ESTRACE Take 1 mg by mouth in the morning.   gabapentin  300 MG capsule Commonly known as: NEURONTIN  Take 2 capsules (600 mg total) by mouth 3 (three) times daily.   ibuprofen 200 MG tablet Commonly known as: ADVIL Take 600 mg by mouth daily.   lansoprazole 30 MG capsule Commonly known as: PREVACID Take 30 mg by mouth daily before breakfast.   levothyroxine  137 MCG tablet Commonly known as: SYNTHROID  Take 137 mcg by mouth  daily before breakfast.   losartan-hydrochlorothiazide 50-12.5 MG tablet Commonly known as: HYZAAR Take 1 tablet by mouth in the morning.   magnesium gluconate 500 (27 Mg) MG Tabs tablet Commonly known as: MAGONATE Take 500 mg by mouth 2 (two) times a week. At night.   methocarbamol 500 MG tablet Commonly known as: ROBAXIN Take 1 tablet (500 mg total) by mouth every 8 (eight) hours as needed for up to 10 days for muscle spasms.   NIFEdipine  30 MG 24 hr tablet Commonly known as: PROCARDIA -XL/NIFEDICAL-XL Take 30 mg by mouth in the morning.   ondansetron  4 MG tablet Commonly known as: Zofran  Take 1 tablet (4 mg total) by mouth every 8 (eight) hours as needed for up to 14 days for nausea or vomiting.   oxyCODONE  5 MG immediate release tablet Commonly known as: Roxicodone  Take 1 tablet (5 mg total) by mouth every 4 (four) hours as needed for up to 7 days for severe pain (pain score 7-10) or moderate pain (pain score 4-6).   polyethylene glycol 17 g packet Commonly known as: MiraLax  Take 17 g by mouth daily.   Toviaz 8 MG Tb24 tablet Generic drug: fesoterodine Take 8 mg by mouth in the morning.   VITAMIN B-12 PO Take 1 tablet by mouth in the morning.   VITAMIN D  PO Take 2,000 Units by mouth in the morning.   Voltaren 1 % Gel Generic drug: diclofenac Sodium Apply 1 application topically 2 (two) times daily as needed for pain.   zolpidem  5 MG tablet Commonly known as: AMBIEN  Take 5 mg by mouth at bedtime as needed for sleep.        Diagnostic Studies: DG Knee Left Port Result Date: 08/06/2024 CLINICAL DATA:  Postop. EXAM: PORTABLE LEFT KNEE - 1-2 VIEW COMPARISON:  None Available. FINDINGS: Left knee arthroplasty in expected alignment. No periprosthetic lucency or fracture. There has been patellar resurfacing. Recent postsurgical change includes air and edema in the soft tissues and joint space. Wound VAC partially included in the field of view overlying the upper aspect  of the lower leg. IMPRESSION: Left knee arthroplasty without immediate postoperative complication. Electronically Signed   By: Andrea Gasman M.D.   On: 08/06/2024 10:57    Disposition: Discharge disposition: 01-Home or Self Care       Discharge Instructions     Call MD / Call 911   Complete by: As directed    If you experience chest pain or shortness of breath, CALL 911 and be transported to the hospital emergency room.  If you develope a fever above 101 F, pus (white drainage) or increased drainage or redness at the wound, or calf pain, call your  surgeon's office.   Constipation Prevention   Complete by: As directed    Drink plenty of fluids.  Prune juice may be helpful.  You may use a stool softener, such as Colace (over the counter) 100 mg twice a day.  Use MiraLax  (over the counter) for constipation as needed.   Diet - low sodium heart healthy   Complete by: As directed    Increase activity slowly as tolerated   Complete by: As directed    Post-operative opioid taper instructions:   Complete by: As directed    POST-OPERATIVE OPIOID TAPER INSTRUCTIONS: It is important to wean off of your opioid medication as soon as possible. If you do not need pain medication after your surgery it is ok to stop day one. Opioids include: Codeine, Hydrocodone(Norco, Vicodin), Oxycodone (Percocet, oxycontin ) and hydromorphone  amongst others.  Long term and even short term use of opiods can cause: Increased pain response Dependence Constipation Depression Respiratory depression And more.  Withdrawal symptoms can include Flu like symptoms Nausea, vomiting And more Techniques to manage these symptoms Hydrate well Eat regular healthy meals Stay active Use relaxation techniques(deep breathing, meditating, yoga) Do Not substitute Alcohol to help with tapering If you have been on opioids for less than two weeks and do not have pain than it is ok to stop all together.  Plan to wean off of  opioids This plan should start within one week post op of your joint replacement. Maintain the same interval or time between taking each dose and first decrease the dose.  Cut the total daily intake of opioids by one tablet each day Next start to increase the time between doses. The last dose that should be eliminated is the evening dose.           Follow-up Information     Edna Toribio LABOR, MD Follow up in 1 week(s).   Specialty: Orthopedic Surgery Contact information: [address] [place] [address] [phone]                    Discharge Instructions      INSTRUCTIONS AFTER JOINT REPLACEMENT   Remove items at home which could result in a fall. This includes throw rugs or furniture in walking pathways ICE to the affected joint every three hours while awake for 30 minutes at a time, for at least the first 3-5 days, and then as needed for pain and swelling.  Continue to use ice for pain and swelling. You may notice swelling that will progress down to the foot and ankle.  This is normal after surgery.  Elevate your leg when you are not up walking on it.   Continue to use the breathing machine you got in the hospital (incentive spirometer) which will help keep your temperature down.  It is common for your temperature to cycle up and down following surgery, especially at night when you are not up moving around and exerting yourself.  The breathing machine keeps your lungs expanded and your temperature down.  DIET:  As you were doing prior to hospitalization, we recommend a well-balanced diet.  DRESSING / WOUND CARE / SHOWERING:  Keep the surgical dressing until follow up.  The dressing is water proof, so you can shower without any extra covering.  IF THE DRESSING FALLS OFF or the wound gets wet inside, change the dressing with sterile gauze.  Please use good hand washing techniques before changing the dressing.  Do not use any lotions  or creams on the  incision until instructed by your surgeon.    ACTIVITY  Increase activity slowly as tolerated, but follow the weight bearing instructions below.   No driving for 6 weeks or until further direction given by your physician.  You cannot drive while taking narcotics.  No lifting or carrying greater than 10 lbs. until further directed by your surgeon. Avoid periods of inactivity such as sitting longer than an hour when not asleep. This helps prevent blood clots.  You may return to work once you are authorized by your doctor.   WEIGHT BEARING: Weight bearing as tolerated with assist device (walker, cane, etc) as directed, use it as long as suggested by your surgeon or therapist, typically at least 4-6 weeks.  EXERCISES  Results after joint replacement surgery are often greatly improved when you follow the exercise, range of motion and muscle strengthening exercises prescribed by your doctor. Safety measures are also important to protect the joint from further injury. Any time any of these exercises cause you to have increased pain or swelling, decrease what you are doing until you are comfortable again and then slowly increase them. If you have problems or questions, call your caregiver or physical therapist for advice.   Rehabilitation is important following a joint replacement. After just a few days of immobilization, the muscles of the leg can become weakened and shrink (atrophy).  These exercises are designed to build up the tone and strength of the thigh and leg muscles and to improve motion. Often times heat used for twenty to thirty minutes before working out will loosen up your tissues and help with improving the range of motion but do not use heat for the first two weeks following surgery (sometimes heat can increase post-operative swelling).   These exercises can be done on a training (exercise) mat, on the floor, on a table or on a bed. Use whatever works the best and is most comfortable for  you.    Use music or television while you are exercising so that the exercises are a pleasant break in your day. This will make your life better with the exercises acting as a break in your routine that you can look forward to.   Perform all exercises about fifteen times, three times per day or as directed.  You should exercise both the operative leg and the other leg as well.  Exercises include:   Quad Sets - Tighten up the muscle on the front of the thigh (Quad) and hold for 5-10 seconds.   Straight Leg Raises - With your knee straight (if you were given a brace, keep it on), lift the leg to 60 degrees, hold for 3 seconds, and slowly lower the leg.  Perform this exercise against resistance later as your leg gets stronger.  Leg Slides: Lying on your back, slowly slide your foot toward your buttocks, bending your knee up off the floor (only go as far as is comfortable). Then slowly slide your foot back down until your leg is flat on the floor again.  Angel Wings: Lying on your back spread your legs to the side as far apart as you can without causing discomfort.  Hamstring Strength:  Lying on your back, push your heel against the floor with your leg straight by tightening up the muscles of your buttocks.  Repeat, but this time bend your knee to a comfortable angle, and push your heel against the floor.  You may put a pillow under the  heel to make it more comfortable if necessary.   A rehabilitation program following joint replacement surgery can speed recovery and prevent re-injury in the future due to weakened muscles. Contact your doctor or a physical therapist for more information on knee rehabilitation.   CONSTIPATION:  Constipation is defined medically as fewer than three stools per week and severe constipation as less than one stool per week.  Even if you have a regular bowel pattern at home, your normal regimen is likely to be disrupted due to multiple reasons following surgery.  Combination of  anesthesia, postoperative narcotics, change in appetite and fluid intake all can affect your bowels.   YOU MUST use at least one of the following options; they are listed in order of increasing strength to get the job done.  They are all available over the counter, and you may need to use some, POSSIBLY even all of these options:    Drink plenty of fluids (prune juice may be helpful) and high fiber foods Colace 100 mg by mouth twice a day  Senokot for constipation as directed and as needed Dulcolax (bisacodyl), take with full glass of water  Miralax  (polyethylene glycol) once or twice a day as needed.  If you have tried all these things and are unable to have a bowel movement in the first 3-4 days after surgery call either your surgeon or your primary doctor.    If you experience loose stools or diarrhea, hold the medications until you stool forms back up.  If your symptoms do not get better within 1 week or if they get worse, check with your doctor.  If you experience the worst abdominal pain ever or develop nausea or vomiting, please contact the office immediately for further recommendations for treatment.  ITCHING:  If you experience itching with your medications, try taking only a single pain pill, or even half a pain pill at a time.  You can also use Benadryl  over the counter for itching or also to help with sleep.   TED HOSE STOCKINGS:  Use stockings on both legs until for at least 2 weeks or as directed by physician office. They may be removed at night for sleeping.  MEDICATIONS:  See your medication summary on the "After Visit Summary" that nursing will review with you.  You may have some home medications which will be placed on hold until you complete the course of blood thinner medication.  It is important for you to complete the blood thinner medication as prescribed.  Blood clot prevention (DVT Prophylaxis): After surgery you are at an increased risk for a blood clot.  You were  prescribed a blood thinner, Aspirin  81mg , to be taken twice daily for a total of 4 weeks from surgery to help reduce your risk of getting a blood clot.  Signs of a pulmonary embolus (blood clot in the lungs) include sudden short of breath, feeling lightheaded or dizzy, chest pain with a deep breath, rapid pulse rapid breathing.  Signs of a blood clot in your arms or legs include new unexplained swelling and cramping, warm, red or darkened skin around the painful area.  Please call the office or 911 right away if these signs or symptoms develop.  PRECAUTIONS:   If you experience chest pain or shortness of breath - call 911 immediately for transfer to the hospital emergency department.   If you develop a fever greater that 101 F, purulent drainage from wound, increased redness or drainage from wound, foul odor  from the wound/dressing, or calf pain - CONTACT YOUR SURGEON.                                                   FOLLOW-UP APPOINTMENTS:  If you do not already have a post-op appointment, please call the office for an appointment to be seen by your surgeon.  Guidelines for how soon to be seen are listed in your "After Visit Summary", but are typically between 2-3 weeks after surgery.  If you have a specialized bandage, you may be told to follow up 1 week after surgery.  OTHER INSTRUCTIONS:  Knee Replacement:  Do not place pillow under knee, focus on keeping the knee straight while resting.  Place foam block, curve side up under heel at all times except when walking.  DO NOT modify, tear, cut, or change the foam block in any way.  POST-OPERATIVE OPIOID TAPER INSTRUCTIONS: It is important to wean off of your opioid medication as soon as possible. If you do not need pain medication after your surgery it is ok to stop day one. Opioids include: Codeine, Hydrocodone(Norco, Vicodin), Oxycodone (Percocet, oxycontin ) and hydromorphone  amongst others.  Long term and even short term use of opiods can  cause: Increased pain response Dependence Constipation Depression Respiratory depression And more.  Withdrawal symptoms can include Flu like symptoms Nausea, vomiting And more Techniques to manage these symptoms Hydrate well Eat regular healthy meals Stay active Use relaxation techniques(deep breathing, meditating, yoga) Do Not substitute Alcohol to help with tapering If you have been on opioids for less than two weeks and do not have pain than it is ok to stop all together.  Plan to wean off of opioids This plan should start within one week post op of your joint replacement. Maintain the same interval or time between taking each dose and first decrease the dose.  Cut the total daily intake of opioids by one tablet each day Next start to increase the time between doses. The last dose that should be eliminated is the evening dose.   MAKE SURE YOU:  Understand these instructions.  Get help right away if you are not doing well or get worse.    Thank you for letting us  be a part of your medical care team.  It is a privilege we respect greatly.  We hope these instructions will help you stay on track for a fast and full recovery!            Signed: Adric Wrede A Zyla Dascenzo 08/07/2024, 6:46 AM

## 2024-08-07 NOTE — Progress Notes (Signed)
     Subjective:  Patient reports pain as mild.  Worked well with physical therapy yesterday.  Pain is well-controlled.  No issues overnight.  Slept well.  Anticipate discharge home today.  Yesterday's total administered Morphine Milligram Equivalents: 30   Objective:   VITALS:   Vitals:   08/06/24 1751 08/06/24 2149 08/07/24 0152 08/07/24 0550  BP: 125/65 (!) 146/70 (!) 152/80 (!) 134/96  Pulse: 73 79 67 65  Resp: 16 15  15   Temp: 98.2 F (36.8 C) 98.3 F (36.8 C)  97.9 F (36.6 C)  TempSrc: Oral Oral  Oral  SpO2: 95% 94%  99%  Weight:      Height:        Sensation intact distally Intact pulses distally Dorsiflexion/Plantar flexion intact Incision: dressing C/D/I Compartment soft Wound VAC holding suction no output in canister  Lab Results  Component Value Date   WBC 7.7 08/07/2024   HGB 11.7 (L) 08/07/2024   HCT 38.0 08/07/2024   MCV 92.5 08/07/2024   PLT 257 08/07/2024   BMET    Component Value Date/Time   NA 140 08/07/2024 0356   K 4.1 08/07/2024 0356   CL 105 08/07/2024 0356   CO2 26 08/07/2024 0356   GLUCOSE 113 (H) 08/07/2024 0356   BUN 16 08/07/2024 0356   CREATININE 0.70 08/07/2024 0356   CALCIUM 9.3 08/07/2024 0356   GFRNONAA >60 08/07/2024 0356    Xray: Total knee arthroplasty components in good alignment no adverse features  Assessment/Plan: 1 Day Post-Op   Principal Problem:   Failed total knee replacement  Status post revision left knee arthroplasty liner exchange 08/06/2024   Post op recs: WB: WBAT Abx: ancef  in house, discharged on cefadroxil Imaging: PACU xrays DVT prophylaxis: Aspirin  81mg  BID x4 weeks Follow up: 2 weeks after surgery for a wound check with Dr. Edna at [facility].  Address: [address], [place], [place] [address]  Office Phone: [phone]    TORIBIO DELENA EDNA 08/07/2024, 6:44 AM   TORIBIO Edna, MD  Contact information:   [phone] 7am-5pm epic message Dr.  Edna, or call office for patient follow up: [phone] After hours and holidays please check [email or web address] for group call information for [facility]

## 2024-08-07 NOTE — Progress Notes (Signed)
 Physical Therapy Treatment Patient Details Name: CODA FILLER MRN: [id number] DOB: April 01, 1951 Today's Date: 08/07/2024   History of Present Illness 73 yo female s/p Revision L TKA  synovectomy and liner exchange on 08/06/24. PMH:  bil TKAs, HTN, R THA    PT Comments  Pt progressing very well this session, meeting goals; reports episode of L thigh pain/spasm while attempting to stand from low toilet with dtr assisting. RN gave meds during session, provided with hot pack for thigh per pt and dtr request. Knee ROM ~ 0 to 985* AROM. Pt is ready to d/c from  PT standpoint with dtr assisting prn   If plan is discharge home, recommend the following: A little help with walking and/or transfers;A little help with bathing/dressing/bathroom;Help with stairs or ramp for entrance;Assist for transportation   Can travel by private vehicle        Equipment Recommendations  None recommended by PT    Recommendations for Other Services       Precautions / Restrictions Precautions Precautions: Knee;Fall Precaution Booklet Issued: No Restrictions Weight Bearing Restrictions Per Provider Order: No LLE Weight Bearing Per Provider Order: Weight bearing as tolerated Other Position/Activity Restrictions: wbat     Mobility  Bed Mobility Overal bed mobility: Needs Assistance Bed Mobility: Sit to Supine       Sit to supine: Supervision   General bed mobility comments: for lines and safety    Transfers Overall transfer level: Needs assistance Equipment used: Rolling walker (2 wheels) Transfers: Sit to/from Stand Sit to Stand: Supervision, Contact guard assist           General transfer comment: verbal cues for hand placement and LLE postion    Ambulation/Gait Ambulation/Gait assistance: Contact guard assist, Supervision Gait Distance (Feet): 90 Feet Assistive device: Rolling walker (2 wheels) Gait Pattern/deviations: Step-through pattern, Decreased stance time - left Gait  velocity: decr     General Gait Details: cues for sequence and RW position; steady gait, no LOB   Stairs Stairs: Yes Stairs assistance: Contact guard assist, Supervision Stair Management: One rail Left, Step to pattern, Sideways Number of Stairs: 2 General stair comments: cues for technique and sequence, no LOB; dtr present   Wheelchair Mobility     Tilt Bed    Modified Rankin (Stroke Patients Only)       Balance                                            Communication Communication Communication: No apparent difficulties  Cognition Arousal: Alert Behavior During Therapy: WFL for tasks assessed/performed   PT - Cognitive impairments: No apparent impairments                         Following commands: Intact      Cueing    Exercises Other Exercises Other Exercises: quad sets, heel slides x3 LLE-ltd by guarding and pain    General Comments        Pertinent Vitals/Pain Pain Assessment Pain Assessment: Faces Faces Pain Scale: Hurts even more Pain Location: L thigh Pain Descriptors / Indicators: Contraction, Grimacing, Guarding Pain Intervention(s): Limited activity within patient's tolerance, Monitored during session, Premedicated before session, Repositioned, Heat applied (heat to upper thigh per pt and dtr request)    Home Living  Prior Function            PT Goals (current goals can now be found in the care plan section) Acute Rehab PT Goals Patient Stated Goal: less back pain PT Goal Formulation: With patient Time For Goal Achievement: 08/13/24 Potential to Achieve Goals: Good Progress towards PT goals: Progressing toward goals    Frequency    7X/week      PT Plan      Co-evaluation              AM-PAC PT 6 Clicks Mobility   Outcome Measure  Help needed turning from your back to your side while in a flat bed without using bedrails?: A Little Help needed moving  from lying on your back to sitting on the side of a flat bed without using bedrails?: A Little Help needed moving to and from a bed to a chair (including a wheelchair)?: A Little Help needed standing up from a chair using your arms (e.g., wheelchair or bedside chair)?: A Little Help needed to walk in hospital room?: A Little Help needed climbing 3-5 steps with a railing? : A Little 6 Click Score: 18    End of Session Equipment Utilized During Treatment: Gait belt Activity Tolerance: Patient tolerated treatment well Patient left: in bed;with call bell/phone within reach;with nursing/sitter in room;with family/visitor present   PT Visit Diagnosis: Other abnormalities of gait and mobility (R26.89);Difficulty in walking, not elsewhere classified (R26.2)     Time: 0950-1009 PT Time Calculation (min) (ACUTE ONLY): 19 min  Charges:    $Gait Training: 8-22 mins PT General Charges $$ ACUTE PT VISIT: 1 Visit                     Manjinder Breau, PT  Acute Rehab Dept [facility]) [phone]  08/07/2024    [facility] 08/07/2024, 10:35 AM

## 2024-08-07 NOTE — Plan of Care (Signed)
  Problem: Education: Goal: Knowledge of General Education information will improve Description: Including pain rating scale, medication(s)/side effects and non-pharmacologic comfort measures Outcome: Progressing   Problem: Clinical Measurements: Goal: Ability to maintain clinical measurements within normal limits will improve Outcome: Progressing   Problem: Coping: Goal: Level of anxiety will decrease Outcome: Progressing   Problem: Pain Management: Goal: Pain level will decrease with appropriate interventions Outcome: Progressing

## 2024-08-11 LAB — AEROBIC/ANAEROBIC CULTURE W GRAM STAIN (SURGICAL/DEEP WOUND)
Culture: NO GROWTH
Gram Stain: NONE SEEN

## 2024-09-13 ENCOUNTER — Other Ambulatory Visit

## 2024-09-13 DIAGNOSIS — Z006 Encounter for examination for normal comparison and control in clinical research program: Secondary | ICD-10-CM

## 2024-09-24 LAB — GENECONNECT MOLECULAR SCREEN: Genetic Analysis Overall Interpretation: NEGATIVE

## 2024-10-08 ENCOUNTER — Ambulatory Visit: Admitting: Neurology

## 2024-10-08 ENCOUNTER — Encounter: Payer: Self-pay | Admitting: Neurology

## 2024-10-08 VITALS — BP 130/80 | HR 67 | Ht 67.5 in | Wt 171.0 lb

## 2024-10-08 DIAGNOSIS — G629 Polyneuropathy, unspecified: Secondary | ICD-10-CM | POA: Diagnosis not present

## 2024-10-08 MED ORDER — GABAPENTIN 800 MG PO TABS: 800.0000 mg | ORAL_TABLET | Freq: Three times a day (TID) | ORAL | 3 refills | Status: AC

## 2024-10-08 NOTE — Progress Notes (Signed)
 Follow-up Visit   Date: 10/08/2024    Jackie Cooke MRN: [id number] DOB: 09-17-1951    Jackie Cooke is a 73 y.o. right-handed Caucasian female with GERD, hypothyroidism, hypertension, depression, and chronic low back pain returning to the clinic for follow-up of small fiber neuropathy.  The patient was accompanied to the clinic by daughter who also provides collateral information.    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   - Increase gabapentin  to 800mg  three times daily  - Consider duloxetine going forward   Return to clinic in 6 months  --------------------------------------------- History of present illness: For the past two years, she has chronic bilateral feet pain and burning sensation behind the left knee.  She also has sharp pain involving the heel and arches of the pain.  There is no specific trigger, such as activity or rest. She also has chronic low back pain and completed physical therapy, with no significant benefit.  She reports having ESI last week and had resolution of her feet and leg pain for about a day, and then it returned.    She takes gabapentin  300mg  three times daily.  She takes NSAIDs daily and applies volteran ointment which gives her most relief.  In the past, she was taking low does prednisone  which also controlled her pain, but due to absence of primary autoimmune condition, this was stopped.     She moved from [place], [place]  in 2017 after her husband passed away.     UPDATE 05/27/2024: She is here for follow-up visit accompanied by her daughter.  Since her last visit, she had NCS/EMG of the legs which suggested L3-4 radiculopathy, no large fiber neuropathy.  MRI lumbar spine shows multilevel right neural foraminal narrowing, worse at the L3-4 right lateral recess stenosis possibly causing impingement on the right L4 nerve root.    Further testing with skin biopsy for small fiber  neuropathy returned positive.  She continues to have left posterior knee pain, left ankle pain, and left ankle swelling. She has burning pain in the feet.  UPDATE 10/08/2024: Discussed the use of AI scribe software for clinical note transcription with the patient, who gave verbal consent to proceed.  History of Present Illness Jackie Cooke is a 73 year old female with neuropathy who presents for management of neuropathic pain.  She experiences neuropathy primarily in her feet, extending up to her knees, described as sensations of 'feet on fire or a block of ice'. The neuropathic pain worsens in the afternoon and evening. She finds some relief using a heating pad and gabapentin . She is currently taking gabapentin  600 mg three times a day.    She underwent a second left knee replacement surgery eight weeks ago, and is doing very well.  She plans to visit her 29 year old mother in [place], who had a hemorrhagic stroke in 2024 but has since recovered well with family support. Her mother lives independently with assistance.  Medications:  Current Outpatient Medications on File Prior to Visit  Medication Sig Dispense Refill   Biotin 89999 MCG TBDP Take 10,000 mcg by mouth in the morning.     Calcium Citrate (CITRACAL PO) Take 1 tablet by mouth in the morning.     Cyanocobalamin (VITAMIN B-12 PO) Take 1 tablet by mouth in the morning.     escitalopram  (LEXAPRO ) 10 MG tablet Take 10 mg by mouth in the morning.     estradiol (ESTRACE) 1 MG tablet Take  1 mg by mouth in the morning.     fesoterodine  (TOVIAZ ) 8 MG TB24 tablet Take 8 mg by mouth in the morning.     gabapentin  (NEURONTIN ) 300 MG capsule Take 2 capsules (600 mg total) by mouth 3 (three) times daily. 180 capsule 5   ibuprofen (ADVIL) 200 MG tablet Take 600 mg by mouth daily.     lansoprazole (PREVACID) 30 MG capsule Take 30 mg by mouth daily before breakfast.     levothyroxine  (SYNTHROID , LEVOTHROID) 137 MCG tablet Take 137 mcg by mouth  daily before breakfast.      losartan -hydrochlorothiazide  (HYZAAR) 50-12.5 MG tablet Take 1 tablet by mouth in the morning.     magnesium  gluconate (MAGONATE) 500 (27 Mg) MG TABS tablet Take 500 mg by mouth 2 (two) times a week. At night.     NIFEdipine  (PROCARDIA -XL/ADALAT -CC/NIFEDICAL-XL) 30 MG 24 hr tablet Take 30 mg by mouth in the morning.     VITAMIN D  PO Take 2,000 Units by mouth in the morning.     VOLTAREN 1 % GEL Apply 1 application topically 2 (two) times daily as needed for pain.  0   zolpidem  (AMBIEN ) 5 MG tablet Take 5 mg by mouth at bedtime as needed for sleep.  1   No current facility-administered medications on file prior to visit.    Allergies:  Allergies  Allergen Reactions   Morphine Anaphylaxis and Shortness Of Breath   Celebrex [Celecoxib] Hives    Vital Signs:  BP 130/80   Pulse 67   Ht 5' 7.5 (1.715 m)   Wt 171 lb (77.6 kg)   SpO2 97%   BMI 26.39 kg/m   Neurological Exam: MENTAL STATUS including orientation to time, place, person, recent and remote memory, attention span and concentration, language, and fund of knowledge is normal.  Speech is not dysarthric.  CRANIAL NERVES:   Normal conjugate, extra-ocular eye movements in all directions of gaze.  No ptosis .  Face is symmetric.   MOTOR:  Motor strength is 5/5 in all extremities, including distally.  No atrophy, fasciculations or abnormal movements.  No pronator drift.  Tone is normal.     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 There is no evidence of a large fiber sensorimotor polyneuropathy affecting the lower extremities.  MRI lumbar spine wo contrast 04/23/2024: 1. Bilateral facet arthropathy at L3-4 with joint effusions and periarticular edema, which can be a cause of back pain. 2. Severe narrowing of the right lateral recess at L3-4  with mass effect on the traversing right L4 nerve roots in the subarticular zone. 3. Moderate right neural foraminal narrowing at multiple levels, as detailed above.  Lab Results  Component Value Date   ESRSEDRATE 9 05/22/2024   Lab Results  Component Value Date   CRP 3.5 05/22/2024   Lab Results  Component Value Date   VITAMINB12 678 05/22/2024   Lab Results  Component Value Date   FOLATE 18.8 05/22/2024      Thank you for allowing me to participate in patient's care.  If I can answer any additional questions, I would be pleased to do so.    Sincerely,    Sofya Moustafa K. Tobie, DO

## 2024-10-14 ENCOUNTER — Encounter: Payer: Self-pay | Admitting: Neurology

## 2025-04-08 ENCOUNTER — Ambulatory Visit: Payer: Self-pay | Admitting: Neurology
